# Patient Record
Sex: Female | Born: 1990
Health system: Southern US, Community
[De-identification: ages and names within clinical notes are randomized; demographics above are authoritative.]

## PROBLEM LIST (undated history)

## (undated) DIAGNOSIS — I10 Essential (primary) hypertension: Secondary | ICD-10-CM

## (undated) DIAGNOSIS — E119 Type 2 diabetes mellitus without complications: Secondary | ICD-10-CM

## (undated) DIAGNOSIS — D649 Anemia, unspecified: Secondary | ICD-10-CM

---

## 2014-08-09 ENCOUNTER — Emergency Department (HOSPITAL_COMMUNITY)
Admission: EM | Admit: 2014-08-09 | Discharge: 2014-08-09 | Disposition: A | Discharge status: Home / Self Care | Payer: Commercial Managed Care - PPO | Attending: Emergency Medicine | Admitting: Emergency Medicine

## 2014-08-09 ENCOUNTER — Encounter (HOSPITAL_COMMUNITY): Payer: Self-pay | Admitting: Emergency Medicine

## 2014-08-09 DIAGNOSIS — G43009 Migraine without aura, not intractable, without status migrainosus: Secondary | ICD-10-CM | POA: Insufficient documentation

## 2014-08-09 DIAGNOSIS — R51 Headache: Secondary | ICD-10-CM | POA: Diagnosis present

## 2014-08-09 MED ORDER — DIPHENHYDRAMINE HCL 50 MG/ML IJ SOLN
25.0000 mg | Freq: Once | INTRAMUSCULAR | Status: AC
  Administered 2014-08-09: 25 mg via INTRAVENOUS
  Filled 2014-08-09: qty 1

## 2014-08-09 MED ORDER — METOCLOPRAMIDE HCL 5 MG/ML IJ SOLN
10.0000 mg | Freq: Once | INTRAMUSCULAR | Status: AC
  Administered 2014-08-09: 10 mg via INTRAVENOUS
  Filled 2014-08-09: qty 2

## 2014-08-09 NOTE — ED Provider Notes (Signed)
CSN: [id number]     Arrival date & time 08/09/14  0053 History   First MD Initiated Contact with Patient 08/09/14 0104     Chief Complaint  Patient presents with  . Headache     (Consider location/radiation/quality/duration/timing/severity/associated sxs/prior Treatment) HPI Comments: Patient presents with c/o HA for approximately 6 hours. Patient describes onset of generalized headache pain, now more right-sided, throbbing. Positive photophobia. No phonophobia. No nausea or vomiting, or diarrhea. Patient usually takes Tylenol for her headaches but has not taken anything tonight. No head injury. No dental pain or sinus pressure. No pain with movement of neck. Patient denies signs of stroke including: facial droop, slurred speech, aphasia, weakness/numbness in extremities, imbalance/trouble walking. No vision loss or change.  The history is provided by the patient.    History reviewed. No pertinent past medical history. History reviewed. No pertinent past surgical history. No family history on file. History  Substance Use Topics  . Smoking status: Never Smoker   . Smokeless tobacco: Not on file  . Alcohol Use: No   OB History    No data available     Review of Systems  Constitutional: Negative for fever.  HENT: Negative for congestion, dental problem, rhinorrhea and sinus pressure.   Eyes: Positive for photophobia. Negative for discharge, redness and visual disturbance.  Respiratory: Negative for shortness of breath.   Cardiovascular: Negative for chest pain.  Gastrointestinal: Negative for nausea and vomiting.  Musculoskeletal: Negative for gait problem, neck pain and neck stiffness.  Skin: Negative for rash.  Neurological: Positive for headaches. Negative for syncope, speech difficulty, weakness, light-headedness and numbness.  Psychiatric/Behavioral: Negative for confusion.    Allergies  Review of patient's allergies indicates no known allergies.  Home Medications    Prior to Admission medications   Not on File   BP 162/81 mmHg  Pulse 111  Temp(Src) 98 F (36.7 C) (Oral)  Resp 18  Ht  (1.6 m)  Wt 260 lb (117.935 kg)  BMI 46.07 kg/m2  SpO2 100%  LMP 08/06/2014   Physical Exam  Constitutional: She is oriented to person, place, and time. She appears well-developed and well-nourished.  HENT:  Head: Normocephalic and atraumatic.  Right Ear: Tympanic membrane, external ear and ear canal normal.  Left Ear: Tympanic membrane, external ear and ear canal normal.  Nose: Nose normal.  Mouth/Throat: Uvula is midline, oropharynx is clear and moist and mucous membranes are normal.  Eyes: Conjunctivae, EOM and lids are normal. Pupils are equal, round, and reactive to light. Right eye exhibits no nystagmus. Left eye exhibits no nystagmus.  No papilledema noted on funduscopic exam.   Neck: Normal range of motion. Neck supple.  Cardiovascular: Normal rate and regular rhythm.   Pulmonary/Chest: Effort normal and breath sounds normal. No respiratory distress. She has no wheezes. She has no rales.  Abdominal: Soft. There is no tenderness.  Musculoskeletal:       Cervical back: She exhibits normal range of motion, no tenderness and no bony tenderness.  Neurological: She is alert and oriented to person, place, and time. She has normal strength and normal reflexes. No cranial nerve deficit or sensory deficit. She displays a negative Romberg sign. Coordination and gait normal. GCS eye subscore is 4. GCS verbal subscore is 5. GCS motor subscore is 6.  Skin: Skin is warm and dry.  Psychiatric: She has a normal mood and affect.  Nursing note and vitals reviewed.   ED Course  Procedures (including critical care time) Labs Review  Labs Reviewed - No data to display  Imaging Review No results found.   EKG Interpretation None       1:10 AM Patient seen and examined. Medications ordered.   Vital signs reviewed and are as follows: BP 162/81 mmHg  Pulse  111  Temp(Src) 98 F (36.7 C) (Oral)  Resp 18  Ht 5\' 3"  (1.6 m)  Wt 260 lb (117.935 kg)  BMI 46.07 kg/m2  SpO2 100%  LMP 08/06/2014  2:24 AM HA nearly resolved. Pt appears well. Neuro exam unchanged. Will d/c to home.   Patient urged to return with worsening symptoms or other concerns. Patient verbalized understanding and agrees with plan.    MDM   Final diagnoses:  Migraine without aura and without status migrainosus, not intractable   Patient without high-risk features of headache including: sudden onset/thunderclap HA, no similar headache in past, altered mental status, accompanying seizure, headache with exertion, age > 6650, history of immunocompromise, neck or shoulder pain, fever, use of anticoagulation, family history of spontaneous SAH, concomitant drug use, toxic exposure.   Patient has a normal complete neurological exam, normal vital signs, normal level of consciousness, no signs of meningismus, is well-appearing/non-toxic appearing, no signs of trauma, no papilledema.   Imaging with CT/MRI not indicated given history and physical exam findings.   No dangerous or life-threatening conditions suspected or identified by history, physical exam, and by work-up. No indications for hospitalization identified.      Renne CriglerJoshua Lyla Jasek, PA-C 08/09/14 0225  Cy BlamerApril Palumbo, MD 08/09/14 [phone]

## 2014-08-09 NOTE — Discharge Instructions (Signed)
Please read and follow all provided instructions.  Your diagnoses today include:  1. Migraine without aura and without status migrainosus, not intractable     Tests performed today include:  Vital signs. See below for your results today.   Medications:  In the Emergency Department you received:  Reglan - antinausea/headache medication  Benadryl - antihistamine to counteract potential side effects of reglan  Take any prescribed medications only as directed.  Additional information:  Follow any educational materials contained in this packet.  You are having a headache. No specific cause was found today for your headache. It may have been a migraine or other cause of headache. Stress, anxiety, fatigue, and depression are common triggers for headaches.   Your headache today does not appear to be life-threatening or require hospitalization, but often the exact cause of headaches is not determined in the emergency department. Therefore, follow-up with your doctor is very important to find out what may have caused your headache and whether or not you need any further diagnostic testing or treatment.   Sometimes headaches can appear benign (not harmful), but then more serious symptoms can develop which should prompt an immediate re-evaluation by your doctor or the emergency department.  BE VERY CAREFUL not to take multiple medicines containing Tylenol (also called acetaminophen). Doing so can lead to an overdose which can damage your liver and cause liver failure and possibly death.   Follow-up instructions: Please follow-up with your primary care provider in the next 3 days for further evaluation of your symptoms.   Return instructions:   Please return to the Emergency Department if you experience worsening symptoms.  Return if the medications do not resolve your headache, if it recurs, or if you have multiple episodes of vomiting or cannot keep down fluids.  Return if you have a  change from the usual headache.  RETURN IMMEDIATELY IF you:  Develop a sudden, severe headache  Develop confusion or become poorly responsive or faint  Develop a fever above 100.52F or problem breathing  Have a change in speech, vision, swallowing, or understanding  Develop new weakness, numbness, tingling, incoordination in your arms or legs  Have a seizure  Please return if you have any other emergent concerns.  Additional Information:  Your vital signs today were: BP 162/81 mmHg   Pulse 111   Temp(Src) 98 F (36.7 C) (Oral)   Resp 18   Ht 5\' 3"  (1.6 m)   Wt 260 lb (117.935 kg)   BMI 46.07 kg/m2   SpO2 100%   LMP 08/06/2014 If your blood pressure (BP) was elevated above 135/85 this visit, please have this repeated by your doctor within one month. --------------

## 2014-08-09 NOTE — ED Notes (Signed)
Pt presents with HA onset yesterday. Denies n/v/d, +photophobia.

## 2014-09-13 ENCOUNTER — Emergency Department (HOSPITAL_COMMUNITY)
Admission: EM | Admit: 2014-09-13 | Discharge: 2014-09-13 | Disposition: A | Discharge status: Home / Self Care | Payer: Commercial Managed Care - PPO | Attending: Emergency Medicine | Admitting: Emergency Medicine

## 2014-09-13 ENCOUNTER — Encounter (HOSPITAL_COMMUNITY): Payer: Self-pay | Admitting: Emergency Medicine

## 2014-09-13 DIAGNOSIS — Y998 Other external cause status: Secondary | ICD-10-CM | POA: Insufficient documentation

## 2014-09-13 DIAGNOSIS — X58XXXA Exposure to other specified factors, initial encounter: Secondary | ICD-10-CM | POA: Diagnosis not present

## 2014-09-13 DIAGNOSIS — Y9289 Other specified places as the place of occurrence of the external cause: Secondary | ICD-10-CM | POA: Diagnosis not present

## 2014-09-13 DIAGNOSIS — T161XXA Foreign body in right ear, initial encounter: Secondary | ICD-10-CM | POA: Diagnosis present

## 2014-09-13 DIAGNOSIS — Y9389 Activity, other specified: Secondary | ICD-10-CM | POA: Insufficient documentation

## 2014-09-13 NOTE — ED Notes (Signed)
Pt has had cotton stuck in right ear for 30 minutes. Attempted to remove with tweezers with no success. Denies pain-just says, "it feels funny." A&Ox4 in triage.

## 2014-09-13 NOTE — ED Provider Notes (Signed)
CSN: [id number]     Arrival date & time 09/13/14  1930 History  This chart was scribed for Charlestine Night, PA-C, working with Blake Divine, MD by Chestine Spore, ED Scribe. The patient was seen in room WTR7/WTR7 at 8:45 PM.     Chief Complaint  Patient presents with  . Cotton stuck in ear       The history is provided by the patient. No language interpreter was used.    HPI Comments: Alexandra Berry is a 24 y.o. female who presents to the Emergency Department complaining of cotton stuck in right ear onset 1 hour. Pt notes that she has tried to remove the cotton with tweezers with no success. She notes that there is no pain to her right ear. She denies right ear pain, wound, ear discharge, and any other symptoms.   History reviewed. No pertinent past medical history. History reviewed. No pertinent past surgical history. History reviewed. No pertinent family history. History  Substance Use Topics  . Smoking status: Never Smoker   . Smokeless tobacco: Not on file  . Alcohol Use: No   OB History    No data available     Review of Systems  HENT: Negative for ear discharge and ear pain.        Cotton in right ear  Skin: Negative for color change, rash and wound.      Allergies  Review of patient's allergies indicates no known allergies.  Home Medications   Prior to Admission medications   Medication Sig Start Date End Date Taking? Authorizing Provider  acetaminophen (TYLENOL) 500 MG tablet Take 1,000 mg by mouth every 6 (six) hours as needed for moderate pain or headache.    Historical Provider, MD   BP 173/89 mmHg  Pulse 104  Temp(Src) 98.1 F (36.7 C) (Oral)  Resp 20  Ht  (1.6 m)  Wt 260 lb (117.935 kg)  BMI 46.07 kg/m2  SpO2 100%  LMP 08/18/2014 (Approximate) Physical Exam  Constitutional: She is oriented to person, place, and time. She appears well-developed and well-nourished. No distress.  HENT:  Head: Normocephalic and atraumatic.  Right Ear: A  foreign body is present.  Left Ear: No foreign bodies.  Cotton present in right ear canal.  Neck: No tracheal deviation present.  Pulmonary/Chest: Effort normal. No respiratory distress.  Musculoskeletal: Normal range of motion.  Neurological: She is alert and oriented to person, place, and time.  Skin: Skin is warm and dry.  Psychiatric: She has a normal mood and affect. Her behavior is normal.  Nursing note and vitals reviewed.   ED Course  FOREIGN BODY REMOVAL Date/Time: 09/13/2014 9:35 PM Performed by: Charlestine Night Authorized by: Charlestine Night Consent: Verbal consent obtained. Risks and benefits: risks, benefits and alternatives were discussed Consent given by: patient Patient understanding: patient states understanding of the procedure being performed Patient consent: the patient's understanding of the procedure matches consent given Procedure consent: procedure consent matches procedure scheduled Relevant documents: relevant documents present and verified Body area: ear Location details: right ear Removal mechanism: alligator forceps Complexity: simple 1 objects recovered. Objects recovered: cotton tip swab Post-procedure assessment: foreign body removed Patient tolerance: Patient tolerated the procedure well with no immediate complications   (including critical care time) DIAGNOSTIC STUDIES: Oxygen Saturation is 100% on RA, nl by my interpretation.    COORDINATION OF CARE: 8:46 PM Discussed treatment plan with pt at bedside and pt agreed to plan.    I personally performed the services described  in this documentation, which was scribed in my presence. The recorded information has been reviewed and is accurate. 3  Charlestine Night, PA-C 09/13/14 2137  Blake Divine, MD 09/16/14 [phone]

## 2014-09-13 NOTE — Discharge Instructions (Signed)
Return here as needed. °

## 2016-02-11 ENCOUNTER — Observation Stay (HOSPITAL_COMMUNITY)
Admission: EM | Admit: 2016-02-11 | Discharge: 2016-02-12 | Disposition: A | Discharge status: Home / Self Care | Payer: Commercial Managed Care - PPO | Attending: Internal Medicine | Admitting: Internal Medicine

## 2016-02-11 ENCOUNTER — Encounter (HOSPITAL_COMMUNITY): Payer: Self-pay | Admitting: Emergency Medicine

## 2016-02-11 DIAGNOSIS — Z79899 Other long term (current) drug therapy: Secondary | ICD-10-CM | POA: Diagnosis not present

## 2016-02-11 DIAGNOSIS — R7303 Prediabetes: Secondary | ICD-10-CM | POA: Insufficient documentation

## 2016-02-11 DIAGNOSIS — R000 Tachycardia, unspecified: Secondary | ICD-10-CM | POA: Diagnosis not present

## 2016-02-11 DIAGNOSIS — Z793 Long term (current) use of hormonal contraceptives: Secondary | ICD-10-CM | POA: Diagnosis not present

## 2016-02-11 DIAGNOSIS — N920 Excessive and frequent menstruation with regular cycle: Secondary | ICD-10-CM | POA: Insufficient documentation

## 2016-02-11 DIAGNOSIS — R0602 Shortness of breath: Secondary | ICD-10-CM | POA: Diagnosis not present

## 2016-02-11 DIAGNOSIS — D72829 Elevated white blood cell count, unspecified: Secondary | ICD-10-CM | POA: Insufficient documentation

## 2016-02-11 DIAGNOSIS — Z6841 Body Mass Index (BMI) 40.0 and over, adult: Secondary | ICD-10-CM | POA: Diagnosis not present

## 2016-02-11 DIAGNOSIS — Z7984 Long term (current) use of oral hypoglycemic drugs: Secondary | ICD-10-CM | POA: Diagnosis not present

## 2016-02-11 DIAGNOSIS — I10 Essential (primary) hypertension: Secondary | ICD-10-CM | POA: Insufficient documentation

## 2016-02-11 DIAGNOSIS — D649 Anemia, unspecified: Secondary | ICD-10-CM | POA: Diagnosis not present

## 2016-02-11 DIAGNOSIS — D509 Iron deficiency anemia, unspecified: Secondary | ICD-10-CM | POA: Diagnosis not present

## 2016-02-11 HISTORY — DX: Essential (primary) hypertension: I10

## 2016-02-11 HISTORY — DX: Type 2 diabetes mellitus without complications: E11.9

## 2016-02-11 HISTORY — DX: Anemia, unspecified: D64.9

## 2016-02-11 LAB — COMPREHENSIVE METABOLIC PANEL
ALBUMIN: 3.7 g/dL (ref 3.5–5.0)
ALK PHOS: 60 U/L (ref 38–126)
ALT: 16 U/L (ref 14–54)
ANION GAP: 8 (ref 5–15)
AST: 28 U/L (ref 15–41)
BUN: 13 mg/dL (ref 6–20)
CALCIUM: 8.9 mg/dL (ref 8.9–10.3)
CO2: 24 mmol/L (ref 22–32)
Chloride: 105 mmol/L (ref 101–111)
Creatinine, Ser: 0.49 mg/dL (ref 0.44–1.00)
GFR calc non Af Amer: >60 mL/min (ref >60)
GLUCOSE: 142 mg/dL — AB (ref 65–99)
POTASSIUM: 4.1 mmol/L (ref 3.5–5.1)
Sodium: 137 mmol/L (ref 135–145)
TOTAL PROTEIN: 7.5 g/dL (ref 6.5–8.1)
Total Bilirubin: 0.3 mg/dL (ref 0.3–1.2)

## 2016-02-11 LAB — PROTIME-INR
INR: 1.12
Prothrombin Time: 14.5 seconds (ref 11.4–15.2)

## 2016-02-11 LAB — CBC
HEMATOCRIT: 26.3 % — AB (ref 36.0–46.0)
Hemoglobin: 7 g/dL — ABNORMAL LOW (ref 12.0–15.0)
MCH: 15.9 pg — ABNORMAL LOW (ref 26.0–34.0)
MCHC: 26.6 g/dL — ABNORMAL LOW (ref 30.0–36.0)
MCV: 59.6 fL — ABNORMAL LOW (ref 78.0–100.0)
Platelets: 572 10*3/uL — ABNORMAL HIGH (ref 150–400)
RBC: 4.41 MIL/uL (ref 3.87–5.11)
RDW: 20.4 % — ABNORMAL HIGH (ref 11.5–15.5)
WBC: 10.3 10*3/uL (ref 4.0–10.5)

## 2016-02-11 LAB — IRON AND TIBC
Iron: 10 ug/dL — ABNORMAL LOW (ref 28–170)
Saturation Ratios: 2 % — ABNORMAL LOW (ref 10.4–31.8)
TIBC: 617 ug/dL — AB (ref 250–450)
UIBC: 607 ug/dL

## 2016-02-11 LAB — ABO/RH: ABO/RH(D): O POS

## 2016-02-11 LAB — I-STAT BETA HCG BLOOD, ED (MC, WL, AP ONLY): I-stat hCG, quantitative: <5 m[IU]/mL (ref <5)

## 2016-02-11 LAB — PREPARE RBC (CROSSMATCH)

## 2016-02-11 MED ORDER — VALACYCLOVIR HCL 500 MG PO TABS
500.0000 mg | ORAL_TABLET | Freq: Every day | ORAL | Status: DC
  Filled 2016-02-11 (×2): qty 1

## 2016-02-11 MED ORDER — SODIUM CHLORIDE 0.9 % IV SOLN
Freq: Once | INTRAVENOUS | Status: AC
Start: 2016-02-11 — End: 2016-02-11
  Administered 2016-02-11: 18:00:00 via INTRAVENOUS

## 2016-02-11 MED ORDER — LOSARTAN POTASSIUM 50 MG PO TABS
50.0000 mg | ORAL_TABLET | Freq: Every day | ORAL | Status: DC
  Filled 2016-02-11 (×2): qty 1

## 2016-02-11 MED ORDER — SODIUM CHLORIDE 0.9 % IV BOLUS (SEPSIS)
1000.0000 mL | Freq: Once | INTRAVENOUS | Status: AC
Start: 2016-02-11 — End: 2016-02-11
  Administered 2016-02-11: 1000 mL via INTRAVENOUS

## 2016-02-11 MED ORDER — SODIUM CHLORIDE 0.9% FLUSH: 3.0000 mL | Freq: Two times a day (BID) | INTRAVENOUS | Status: DC

## 2016-02-11 NOTE — ED Notes (Signed)
Patient refusing rectal exam for occult stool test. Made Dr Fredderick PhenixBelfi aware.

## 2016-02-11 NOTE — ED Triage Notes (Addendum)
Pt sent for evaluation for hemoglobin of 6.2; pt reports associated symptoms of dizziness and SOB. Pt just ended heavy menstrual cycle.

## 2016-02-11 NOTE — H&P (Signed)
History and Physical  Alexandra CreamerShaniqua Berry ZOX:[id number] DOB: 12-18-1990 DOA: 02/11/2016  Referring physician: EDP PCP: No PCP Per Patient   Chief Complaint: anemia, sent from urgent care for blood transfusion.  HPI: Alexandra Berry is a 26 y.o. female   With h/o obesity (Body mass index is 44.08 kg/m.), prediabetes, HTN was put on medication a few months ago at a local urgent care center. She was seen at a urgent care a few days ago due to feeling dizzy and sob. She had lab work done showed hgb is 6.2, she was instructed to go the ED for blood transfusion. She states she wasn't able to get here until today because she was out of the area.   She did not  She states she has always had heavy periods, though it has improved since she was put on birth control pills a few months ago,  She's been told she is anemic in the past but has never required a blood transfusion. She just started iron supplementation within the last couple of days. She denies any rectal bleeding, no hematuria, no hematemesis, no hemoptysis.  No lower extremity edema. No chest pain. No cough. No fever, no juandice.   ED course: she presented with sinus tachycardia, bp stable, no hypoxia. EKG with sinus tachycardia, no acute ST/T changes, Labs confirmed microcytic anemia. lft wnl Cr normal. Pregnancy test negative. She is given fluids and started prbc transfusion. hospitalist called to further manage the patient.      Review of Systems:  Detail per HPI, Review of systems are otherwise negative  Past Medical History:  Diagnosis Date  . Anemia   . Diabetes mellitus without complication (HCC)   . Hypertension    History reviewed. No pertinent surgical history. Social History:  reports that she has never smoked. She does not have any smokeless tobacco history on file. She reports that she does not drink alcohol or use drugs. Patient lives at home & is able to participate in activities of daily living independently , she is  currently studies to become a Engineer, civil (consulting)nurse.   Allergies  Allergen Reactions  . Peanut-Containing Drug Products Itching and Swelling    No family history on file.    Prior to Admission medications   Medication Sig Start Date End Date Taking? Authorizing Provider  acetaminophen (TYLENOL) 500 MG tablet Take 1,000 mg by mouth every 6 (six) hours as needed for moderate pain or headache.   Yes Historical Provider, MD  losartan (COZAAR) 50 MG tablet Take 50 mg by mouth daily.   Yes Historical Provider, MD  metFORMIN (GLUCOPHAGE) 500 MG tablet Take 1,000 mg by mouth 2 (two) times daily with a meal.   Yes Historical Provider, MD  Multiple Vitamins-Iron (MULTIVITAMIN/IRON PO) Take 1 tablet by mouth daily.   Yes Historical Provider, MD  Norgestim-Eth Estrad Triphasic (TRI-SPRINTEC PO) Take 1 tablet by mouth daily.   Yes Historical Provider, MD  valACYclovir (VALTREX) 500 MG tablet Take 500 mg by mouth daily.   Yes Historical Provider, MD    Physical Exam: BP 156/82   Pulse 117   Temp 98.8 F (37.1 C)   Resp 19   Ht 5\' 2"  (1.575 m)   Wt 109.3 kg (241 lb)   LMP 02/02/2016   SpO2 100%   BMI 44.08 kg/m   General:  Obese, NAD Eyes: PERRL ENT: unremarkable Neck: supple, no JVD Cardiovascular: sinus tachycardia Respiratory: CTABL Abdomen: soft/ND/ND, positive bowel sounds Skin: no rash Musculoskeletal:  No edema Psychiatric: calm/cooperative  Neurologic: no focal findings            Labs on Admission:  Basic Metabolic Panel:  Recent Labs Lab 02/11/16 1452  NA 137  K 4.1  CL 105  CO2 24  GLUCOSE 142*  BUN 13  CREATININE 0.49  CALCIUM 8.9   Liver Function Tests:  Recent Labs Lab 02/11/16 1452  AST 28  ALT 16  ALKPHOS 60  BILITOT 0.3  PROT 7.5  ALBUMIN 3.7   No results for input(s): LIPASE, AMYLASE in the last 168 hours. No results for input(s): AMMONIA in the last 168 hours. CBC:  Recent Labs Lab 02/11/16 1452  WBC 10.3  HGB 7.0*  HCT 26.3*  MCV 59.6*  PLT  572*   Cardiac Enzymes: No results for input(s): CKTOTAL, CKMB, CKMBINDEX, TROPONINI in the last 168 hours.  BNP (last 3 results) No results for input(s): BNP in the last 8760 hours.  ProBNP (last 3 results) No results for input(s): PROBNP in the last 8760 hours.  CBG: No results for input(s): GLUCAP in the last 168 hours.  Radiological Exams on Admission: No results found.    Assessment/Plan Present on Admission: **None**    Symptomatic Microcytic anemia: likely from heave menses prbc transfusion, likely will benefit from iv iron prior to discharge , and then continue oral iron supplement.   Sob, sinus tachycardia:no hypoxia, no chest pain, no cough.  symptom likely from anemia, she is at baseline active, no edema, no hypoxia, low probability for PE. Expect improvement with blood product and hydration.  HTN; continue home meds losartan.   Heavy menses: she report she is started on birth control pills to help regulate her periods a few months ago, she need to follow up with gyn for this.  Morbid obesity:  Body mass index is 44.08 kg/m. She is diagnosed with prediabetes, recent a1c 4.4. Her home meds metformin held while in the hospital, resume at discharge.   DVT prophylaxis: scd's  Consultants: none  Code Status: full   Family Communication:  Patient and friend in room  Disposition Plan: obs tele, anticipate discharge in am  Time spent:  Shantal Roan MD, PhD [facility] Pager [phone] If 7PM-7AM, please contact night-coverage at [email or web address], password [facility]

## 2016-02-11 NOTE — ED Notes (Signed)
Hospitalist at bedside 

## 2016-02-11 NOTE — ED Provider Notes (Addendum)
[facility]-EMERGENCY DEPT Provider Note   CSN: [id number] Arrival date & time: 02/11/16  1352     History   Chief Complaint Chief Complaint  Patient presents with  . Abnormal Lab    HPI Alexandra Berry is a 26 y.o. female.  Patient with a history of diabetes, hypertension and obesity presents with low hemoglobin. She states over the last 4-5 days she's had dizziness and shortness of breath. She's felt more fatigued than normal. She went to an urgent care where they had blood drawn. The blood work came back yesterday and showed hemoglobin of 6.2. She was instructed to go to the emergency department for a blood transfusion. She states she wasn't able to get here until today because she was out of the area. She states she has always had heavy periods. She's been told she is anemic in the past but has never required a blood transfusion. She just started iron supplementation within the last couple of days. She denies any rectal bleeding. No nausea or vomiting.      Past Medical History:  Diagnosis Date  . Anemia   . Diabetes mellitus without complication (HCC)   . Hypertension     There are no active problems to display for this patient.   History reviewed. No pertinent surgical history.  OB History    No data available       Home Medications    Prior to Admission medications   Medication Sig Start Date End Date Taking? Authorizing Provider  acetaminophen (TYLENOL) 500 MG tablet Take 1,000 mg by mouth every 6 (six) hours as needed for moderate pain or headache.   Yes Historical Provider, MD  losartan (COZAAR) 50 MG tablet Take 50 mg by mouth daily.   Yes Historical Provider, MD  metFORMIN (GLUCOPHAGE) 500 MG tablet Take 1,000 mg by mouth 2 (two) times daily with a meal.   Yes Historical Provider, MD  Multiple Vitamins-Iron (MULTIVITAMIN/IRON PO) Take 1 tablet by mouth daily.   Yes Historical Provider, MD  Norgestim-Eth Estrad Triphasic (TRI-SPRINTEC PO) Take 1 tablet by mouth  daily.   Yes Historical Provider, MD  valACYclovir (VALTREX) 500 MG tablet Take 500 mg by mouth daily.   Yes Historical Provider, MD    Family History No family history on file.  Social History Social History  Substance Use Topics  . Smoking status: Never Smoker  . Smokeless tobacco: Not on file  . Alcohol use No     Allergies   Peanut-containing drug products   Review of Systems Review of Systems  Constitutional: Positive for fatigue. Negative for chills, diaphoresis and fever.  HENT: Negative for congestion, rhinorrhea and sneezing.   Eyes: Negative.   Respiratory: Positive for shortness of breath. Negative for cough and chest tightness.   Cardiovascular: Negative for chest pain and leg swelling.  Gastrointestinal: Negative for abdominal pain, blood in stool, diarrhea, nausea and vomiting.  Genitourinary: Negative for difficulty urinating, flank pain, frequency, hematuria, vaginal bleeding and vaginal discharge.  Musculoskeletal: Negative for arthralgias and back pain.  Skin: Negative for rash.  Neurological: Positive for dizziness and light-headedness. Negative for speech difficulty, weakness, numbness and headaches.     Physical Exam Updated Vital Signs BP 156/82   Pulse 117   Temp 98.8 F (37.1 C)   Resp 19   Ht 5\' 2"  (1.575 m)   Wt 241 lb (109.3 kg)   LMP 02/02/2016   SpO2 100%   BMI 44.08 kg/m   Physical Exam  Constitutional: She  is oriented to person, place, and time. She appears well-developed and well-nourished.  HENT:  Head: Normocephalic and atraumatic.  Eyes: Pupils are equal, round, and reactive to light.  Neck: Normal range of motion. Neck supple.  Cardiovascular: Regular rhythm and normal heart sounds.  Tachycardia present.   Pulmonary/Chest: Effort normal and breath sounds normal. No respiratory distress. She has no wheezes. She has no rales. She exhibits no tenderness.  Abdominal: Soft. Bowel sounds are normal. There is no tenderness. There  is no rebound and no guarding.  Musculoskeletal: Normal range of motion. She exhibits no edema.  Lymphadenopathy:    She has no cervical adenopathy.  Neurological: She is alert and oriented to person, place, and time.  Skin: Skin is warm and dry. No rash noted.  Psychiatric: She has a normal mood and affect.     ED Treatments / Results  Labs (all labs ordered are listed, but only abnormal results are displayed) Labs Reviewed  COMPREHENSIVE METABOLIC PANEL - Abnormal; Notable for the following:       Result Value   Glucose, Bld 142 (*)    All other components within normal limits  CBC - Abnormal; Notable for the following:    Hemoglobin 7.0 (*)    HCT 26.3 (*)    MCV 59.6 (*)    MCH 15.9 (*)    MCHC 26.6 (*)    RDW 20.4 (*)    Platelets 572 (*)    All other components within normal limits  I-STAT BETA HCG BLOOD, ED ([facility], [facility], [facility] ONLY)  POC OCCULT BLOOD, ED  TYPE AND SCREEN  PREPARE RBC (CROSSMATCH)    EKG  EKG Interpretation  Date/Time:  Saturday February 11 2016 15:37:09 EST Ventricular Rate:  117 PR Interval:    QRS Duration: 73 QT Interval:  328 QTC Calculation: 458 R Axis:   58 Text Interpretation:  Sinus tachycardia No old tracing to compare Confirmed by Liberty Seto  MD, Shahil Speegle ([id number]) on 02/11/2016 3:40:36 PM       Radiology No results found.  Procedures Procedures (including critical care time)  Medications Ordered in ED Medications  sodium chloride 0.9 % bolus 1,000 mL (not administered)  0.9 %  sodium chloride infusion (not administered)     Initial Impression / Assessment and Plan / ED Course  I have reviewed the triage vital signs and the nursing notes.  Pertinent labs & imaging results that were available during my care of the patient were reviewed by me and considered in my medical decision making (see chart for details).  Clinical Course     Patient presents with symptomatic anemia. Hemoglobin today is 7.0. She is tachycardic. Her blood pressure  stable. I spoke to her about blood administration and she is amenable. She was type and cross for 2 units of packed RBCs. I spoke with the [facility] service to admit the patient for observation and blood transfusion. Her likely etiology is menorrhagia. She denies any blood in her stool but is currently refusing rectal exam.  Final Clinical Impressions(s) / ED Diagnoses   Final diagnoses:  Symptomatic anemia    New Prescriptions New Prescriptions   No medications on file     Rolan Bucco, MD 02/11/16 6045    Rolan Bucco, MD 02/11/16 1621

## 2016-02-12 ENCOUNTER — Encounter (HOSPITAL_COMMUNITY): Payer: Self-pay

## 2016-02-12 DIAGNOSIS — R000 Tachycardia, unspecified: Secondary | ICD-10-CM | POA: Diagnosis not present

## 2016-02-12 DIAGNOSIS — R7303 Prediabetes: Secondary | ICD-10-CM | POA: Diagnosis not present

## 2016-02-12 DIAGNOSIS — D72829 Elevated white blood cell count, unspecified: Secondary | ICD-10-CM | POA: Diagnosis not present

## 2016-02-12 DIAGNOSIS — D649 Anemia, unspecified: Secondary | ICD-10-CM | POA: Diagnosis not present

## 2016-02-12 DIAGNOSIS — R0602 Shortness of breath: Secondary | ICD-10-CM

## 2016-02-12 LAB — COMPREHENSIVE METABOLIC PANEL
ALK PHOS: 52 U/L (ref 38–126)
ALT: 14 U/L (ref 14–54)
ANION GAP: 8 (ref 5–15)
AST: 21 U/L (ref 15–41)
Albumin: 3.8 g/dL (ref 3.5–5.0)
BILIRUBIN TOTAL: 0.8 mg/dL (ref 0.3–1.2)
BUN: 10 mg/dL (ref 6–20)
CO2: 23 mmol/L (ref 22–32)
Calcium: 8.7 mg/dL — ABNORMAL LOW (ref 8.9–10.3)
Chloride: 108 mmol/L (ref 101–111)
Creatinine, Ser: 0.51 mg/dL (ref 0.44–1.00)
GFR calc Af Amer: >60 mL/min (ref >60)
Glucose, Bld: 93 mg/dL (ref 65–99)
POTASSIUM: 4.3 mmol/L (ref 3.5–5.1)
Sodium: 139 mmol/L (ref 135–145)
TOTAL PROTEIN: 7.2 g/dL (ref 6.5–8.1)

## 2016-02-12 LAB — FOLATE: Folate: 24.7 ng/mL (ref >5.9)

## 2016-02-12 LAB — CBC
HEMATOCRIT: 29.9 % — AB (ref 36.0–46.0)
HEMOGLOBIN: 8.3 g/dL — AB (ref 12.0–15.0)
MCH: 17.3 pg — AB (ref 26.0–34.0)
MCHC: 27.8 g/dL — ABNORMAL LOW (ref 30.0–36.0)
MCV: 62.4 fL — AB (ref 78.0–100.0)
Platelets: 478 10*3/uL — ABNORMAL HIGH (ref 150–400)
RBC: 4.79 MIL/uL (ref 3.87–5.11)
RDW: 22.5 % — AB (ref 11.5–15.5)
WBC: 11.7 10*3/uL — ABNORMAL HIGH (ref 4.0–10.5)

## 2016-02-12 LAB — VITAMIN B12: Vitamin B-12: 929 pg/mL — ABNORMAL HIGH (ref 180–914)

## 2016-02-12 MED ORDER — FERROUS SULFATE 325 (65 FE) MG PO TABS: 325.0000 mg | ORAL_TABLET | Freq: Two times a day (BID) | ORAL | 0 refills | Status: AC

## 2016-02-12 NOTE — Discharge Summary (Signed)
Physician Discharge Summary  Alexandra Berry ZOX:[id number] DOB: 1990/03/26 DOA: 02/11/2016  PCP: No PCP Per Patient  Admit date: 02/11/2016 Discharge date: 02/12/2016  Admitted From: Home Disposition:  Home  Recommendations for Outpatient Follow-up:  1. Follow up with PCP in 1-2 weeks 2. Follow up with OB-GYN in 1 week  3. Please obtain BMP/CBC within in one week  Home Health: No Equipment/Devices: None  Discharge Condition: Stable CODE STATUS: FULL Diet recommendation: Heart Healthy / Carb Modified   Brief/Interim Summary: Alexandra Berry is a 26 y.o. female With h/o obesity (Body mass index is 44.08 kg/m.), prediabetes, HTN was put on medication a few months ago at a local urgent care center. She was seen at a urgent care a few days ago due to feeling dizzy and sob. She had lab work done showed hgb is 6.2, she was instructed to go the ED for blood transfusion. She states she wasn't able to get here until yesterday because she was out of the area. She states she has always had heavy periods, though it has improved since she was put on birth control pills a few months ago,  She's been told she is anemic in the past but has never required a blood transfusion. She just started iron supplementation within the last couple of days. She denies any rectal bleeding, no hematuria, no hematemesis, no hemoptysis.  No lower extremity edema. No chest pain. No cough. No fever, no juandice.  ED course: she presented with sinus tachycardia, bp stable, no hypoxia. EKG with sinus tachycardia, no acute ST/T changes, Labs confirmed microcytic anemia. lft wnl Cr normal. Pregnancy test negative. She is given fluids and started prbc transfusion. hospitalist called to further manage the patient. She was observed overnight and improved with blood transfusion and had no complaints. She was still in Sinus tachycardia but wanted to leave so she will follow up with PCP and Ob-Gyn as an outpatient. She was deemed medically  stable for discharge as she had no symptoms or complaints and felt well.   Discharge Diagnoses:  Active Problems:   Symptomatic anemia  Symptomatic Microcytic anemia likely from Menorrhagia -s/p pRBC transfusion,  -Hb improved to 8.3 from 6.2 -Iron Levels were 10 and TIBC was 617; Discharged on po Iron  -Follow up with OB-GYN for Menorrhagia and PCP -Would not allow Rectal Exam for FOBT  Shortness of Breath -Resolved. No Hypoxia, CP or Cough  Sinus Tachycardia -Symptom likely from anemia, she is at baseline active with no prolonged trips, no edema, no hypoxia, low probability for PE as she has no CP/SOB.  -Intermittent as improved to 97 bpm prior to Discharge -Follow up with PCP as an outpatient; May consider Cardiology Consultation if persistent  Mild Leukocytosis -No Active S/Sx of Infection and Patient Afebrile -Repeat CBC as an outpatient.   HTN -Continue home meds Losartan.   Menorrhagia -She report she is started on birth control pills to help regulate her periods a few months ago -Continue Iron Supplementation -Follow up with OB-GYN  Morbid Obesity:  -Body mass index is 44.08 kg/m.  Prediabetes -Recent A1c 4.4.  -Her home meds metformin held while in the hospital, resume at discharge.  Discharge Instructions  Discharge Instructions    Call MD for:  difficulty breathing, headache or visual disturbances    Complete by:  As directed    Call MD for:  persistant dizziness or light-headedness    Complete by:  As directed    Call MD for:  persistant nausea and  vomiting    Complete by:  As directed    Call MD for:  severe uncontrolled pain    Complete by:  As directed    Call MD for:  temperature >100.4    Complete by:  As directed    Diet - low sodium heart healthy    Complete by:  As directed    Discharge instructions    Complete by:  As directed    Follow up with PCP and OB-GYN; Take medications as prescribed. If symptoms worsen or change please return  to the ED for evaluation.   Increase activity slowly    Complete by:  As directed      Allergies as of 02/12/2016      Reactions   Peanut-containing Drug Products Itching, Swelling      Medication List    TAKE these medications   acetaminophen 500 MG tablet Commonly known as:  TYLENOL Take 1,000 mg by mouth every 6 (six) hours as needed for moderate pain or headache.   ferrous sulfate 325 (65 FE) MG tablet Take 1 tablet (325 mg total) by mouth 2 (two) times daily with a meal.   losartan 50 MG tablet Commonly known as:  COZAAR Take 50 mg by mouth daily.   metFORMIN 500 MG tablet Commonly known as:  GLUCOPHAGE Take 1,000 mg by mouth 2 (two) times daily with a meal.   MULTIVITAMIN/IRON PO Take 1 tablet by mouth daily.   TRI-SPRINTEC PO Take 1 tablet by mouth daily.   valACYclovir 500 MG tablet Commonly known as:  VALTREX Take 500 mg by mouth daily.       Allergies  Allergen Reactions  . Peanut-Containing Drug Products Itching and Swelling    Consultations:  None  Procedures/Studies:  No results found.  Subjective:  Seen and examined at bedside an had no complaints or concerns and felt better and wanted to go home. No SOB or lightheadedness or dizziness now.   Discharge Exam: Vitals:   02/12/16 0320 02/12/16 0432  BP: 138/82 (!) 143/93  Pulse: 99 98  Resp: 17 16  Temp: 98.6 F (37 C) 98.2 F (36.8 C)   Vitals:   02/12/16 0023 02/12/16 0049 02/12/16 0320 02/12/16 0432  BP:  (!) 148/78 138/82 (!) 143/93  Pulse:  93 99 98  Resp:  16 17 16   Temp: 98.5 F (36.9 C) 98.5 F (36.9 C) 98.6 F (37 C) 98.2 F (36.8 C)  TempSrc:  Oral Oral Oral  SpO2:  100% 100% 100%  Weight:      Height:       General: Pt is a morbidly obese AAF who is alert, awake, not in acute distress Cardiovascular: Tachycardic Rate but Regular Rhythm, S1/S2 +, no rubs, no gallops Respiratory: CTA bilaterally, no wheezing, no rhonchi Abdominal: Soft, NT, ND, bowel sounds  + Extremities: no edema, no cyanosis  The results of significant diagnostics from this hospitalization (including imaging, microbiology, ancillary and laboratory) are listed below for reference.    Microbiology: No results found for this or any previous visit (from the past 240 hour(s)).   Labs: BNP (last 3 results) No results for input(s): BNP in the last 8760 hours. Basic Metabolic Panel:  Recent Labs Lab 02/11/16 1452 02/12/16 0701  NA 137 139  K 4.1 4.3  CL 105 108  CO2 24 23  GLUCOSE 142* 93  BUN 13 10  CREATININE 0.49 0.51  CALCIUM 8.9 8.7*   Liver Function Tests:  Recent Labs Lab  02/11/16 1452 02/12/16 0701  AST 28 21  ALT 16 14  ALKPHOS 60 52  BILITOT 0.3 0.8  PROT 7.5 7.2  ALBUMIN 3.7 3.8   No results for input(s): LIPASE, AMYLASE in the last 168 hours. No results for input(s): AMMONIA in the last 168 hours. CBC:  Recent Labs Lab 02/11/16 1452 02/12/16 0701  WBC 10.3 11.7*  HGB 7.0* 8.3*  HCT 26.3* 29.9*  MCV 59.6* 62.4*  PLT 572* 478*   Cardiac Enzymes: No results for input(s): CKTOTAL, CKMB, CKMBINDEX, TROPONINI in the last 168 hours. BNP: Invalid input(s): POCBNP CBG: No results for input(s): GLUCAP in the last 168 hours. D-Dimer No results for input(s): DDIMER in the last 72 hours. Hgb A1c No results for input(s): HGBA1C in the last 72 hours. Lipid Profile No results for input(s): CHOL, HDL, LDLCALC, TRIG, CHOLHDL, LDLDIRECT in the last 72 hours. Thyroid function studies No results for input(s): TSH, T4TOTAL, T3FREE, THYROIDAB in the last 72 hours.  Invalid input(s): FREET3 Anemia work up  Recent Labs  02/11/16 1645 02/12/16 0701  VITAMINB12  --  929*  FOLATE  --  24.7  TIBC 617*  --   IRON 10*  --    Urinalysis No results found for: COLORURINE, APPEARANCEUR, LABSPEC, PHURINE, GLUCOSEU, HGBUR, BILIRUBINUR, KETONESUR, PROTEINUR, UROBILINOGEN, NITRITE, LEUKOCYTESUR Sepsis Labs Invalid input(s): PROCALCITONIN,  WBC,   LACTICIDVEN Microbiology No results found for this or any previous visit (from the past 240 hour(s)).  Time coordinating discharge: Over 30 minutes  SIGNED:  Merlene Laughter, DO [facility] 02/12/2016, 1:36 PM Pager [phone]  If 7PM-7AM, please contact night-coverage [email or web address] Password [email or web address]

## 2016-02-13 LAB — TYPE AND SCREEN
BLOOD PRODUCT EXPIRATION DATE: 201801122359
BLOOD PRODUCT EXPIRATION DATE: 201801192359
ISSUE DATE / TIME: 201801061805
ISSUE DATE / TIME: 201801070013
UNIT TYPE AND RH: 5100
Unit Type and Rh: 5100

## 2016-10-22 ENCOUNTER — Other Ambulatory Visit: Payer: Self-pay | Admitting: Family Medicine

## 2016-10-22 ENCOUNTER — Telehealth (HOSPITAL_COMMUNITY): Payer: Self-pay | Admitting: Family Medicine

## 2016-10-22 DIAGNOSIS — R011 Cardiac murmur, unspecified: Secondary | ICD-10-CM

## 2016-10-29 NOTE — Telephone Encounter (Signed)
User: Trina Ao A Date/time: 10/22/16 3:03 PM  Comment: Called pt and spoke with her to sch echo, she voiced that she just started a new jobs and would have to figure out her schedule..she will CB in a couple of days.   Context:  Outcome: Completed  Phone number: [phone] Phone Type: Home Phone  Comm. type: Telephone Call type: Outgoing  Contact: Junius Creamer Relation to patient: Self

## 2017-01-21 ENCOUNTER — Other Ambulatory Visit: Payer: Self-pay | Admitting: Family Medicine

## 2017-01-21 DIAGNOSIS — R011 Cardiac murmur, unspecified: Secondary | ICD-10-CM

## 2017-01-28 ENCOUNTER — Other Ambulatory Visit: Payer: Self-pay

## 2017-01-28 ENCOUNTER — Ambulatory Visit (HOSPITAL_COMMUNITY): Payer: Managed Care, Other (non HMO) | Attending: Cardiology

## 2017-01-28 DIAGNOSIS — I10 Essential (primary) hypertension: Secondary | ICD-10-CM | POA: Insufficient documentation

## 2017-01-28 DIAGNOSIS — R011 Cardiac murmur, unspecified: Secondary | ICD-10-CM | POA: Diagnosis present

## 2019-04-01 ENCOUNTER — Other Ambulatory Visit: Payer: Self-pay

## 2019-04-01 DIAGNOSIS — N6310 Unspecified lump in the right breast, unspecified quadrant: Secondary | ICD-10-CM

## 2019-04-21 ENCOUNTER — Other Ambulatory Visit: Payer: Self-pay

## 2019-04-21 ENCOUNTER — Encounter: Payer: Self-pay | Admitting: Advanced Practice Midwife

## 2019-04-21 ENCOUNTER — Ambulatory Visit: Payer: Self-pay | Admitting: Advanced Practice Midwife

## 2019-04-21 ENCOUNTER — Ambulatory Visit: Payer: Managed Care, Other (non HMO)

## 2019-04-21 VITALS — BP 148/84 | Temp 98.6°F | Wt 248.0 lb

## 2019-04-21 DIAGNOSIS — Z1239 Encounter for other screening for malignant neoplasm of breast: Secondary | ICD-10-CM

## 2019-04-21 NOTE — Progress Notes (Signed)
Ms. Julien Berryman is a 29 y.o. female who presents to [facility] clinic today with complaint of right breast lump.    Pap Smear: Pap not smear completed today. Last Pap smear was 2019 at [facility] clinic and was normal. Per patient has no history of an abnormal Pap smear. Last Pap smear result is not available in Epic.   Physical exam: Breasts Breasts symmetrical. No skin abnormalities bilateral breasts. No nipple retraction bilateral breasts. No nipple discharge bilateral breasts. No lymphadenopathy. No lumps palpated bilateral breasts.       Pelvic/Bimanual Pap is not indicated today    Smoking History: Patient has never smoked Not referred to quit line.    Patient Navigation: Patient education provided. Access to services provided for patient through [facility] program. No interpreter provided. No transportation provided   Colorectal Cancer Screening: Per patient has never had colonoscopy completed No complaints today.    Breast and Cervical Cancer Risk Assessment: Patient has family history of breast cancer, known genetic mutations, or radiation treatment to the chest before age 89. Patient does not have history of cervical dysplasia, immunocompromised, or DES exposure in-utero.  Risk Assessment    Risk Scores      04/21/2019   Last edited by: Narda Rutherford, LPN   5-year risk:    Lifetime risk:           A: BCCCP exam without pap smear Complaint of right breast lump   P: Referred patient to [facility] for a diagnostic mammogram. Appointment scheduled 04/23/2019 at 3:20pm .  Thressa Sheller DNP, CNM  04/21/19  2:01 PM

## 2019-05-05 ENCOUNTER — Other Ambulatory Visit: Payer: Self-pay

## 2019-05-05 ENCOUNTER — Ambulatory Visit
Admission: RE | Admit: 2019-05-05 | Discharge: 2019-05-05 | Disposition: A | Discharge status: Home / Self Care | Payer: Managed Care, Other (non HMO) | Source: Ambulatory Visit | Attending: Obstetrics and Gynecology | Admitting: Obstetrics and Gynecology

## 2019-05-05 ENCOUNTER — Ambulatory Visit: Admission: RE | Admit: 2019-05-05 | Payer: No Typology Code available for payment source | Source: Ambulatory Visit

## 2019-05-05 DIAGNOSIS — N6310 Unspecified lump in the right breast, unspecified quadrant: Secondary | ICD-10-CM

## 2021-01-19 IMAGING — US US BREAST*R* LIMITED INC AXILLA
1 series · 7 of 7 positions shown · non-contrast
Comparison: Baseline evaluation

CLINICAL DATA: Palpable abnormality in the UPPER-OUTER QUADRANT of
the RIGHT breast. Patient's mother was diagnosed and treated for
breast cancer approximately 1 year ago.

EXAM:
ULTRASOUND OF THE RIGHT BREAST

[Series 1: us breast*right* limited inc axilla · 0.06mm/px · 7 of 7 slices shown]
[im 1/7]
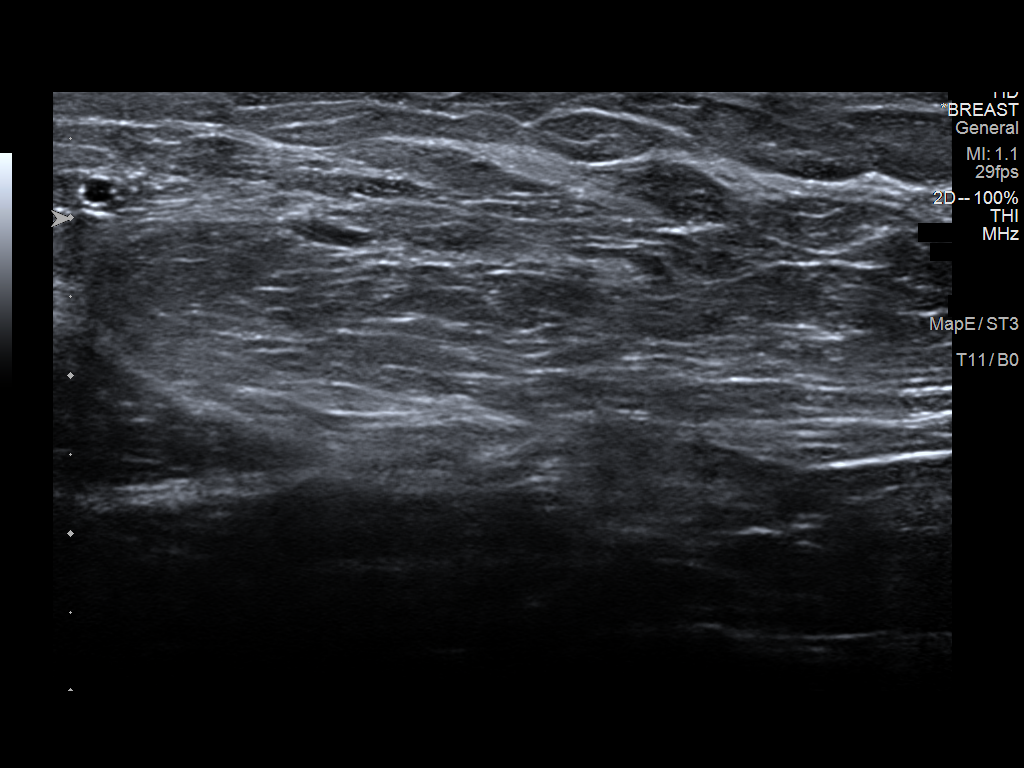
[im 2/7]
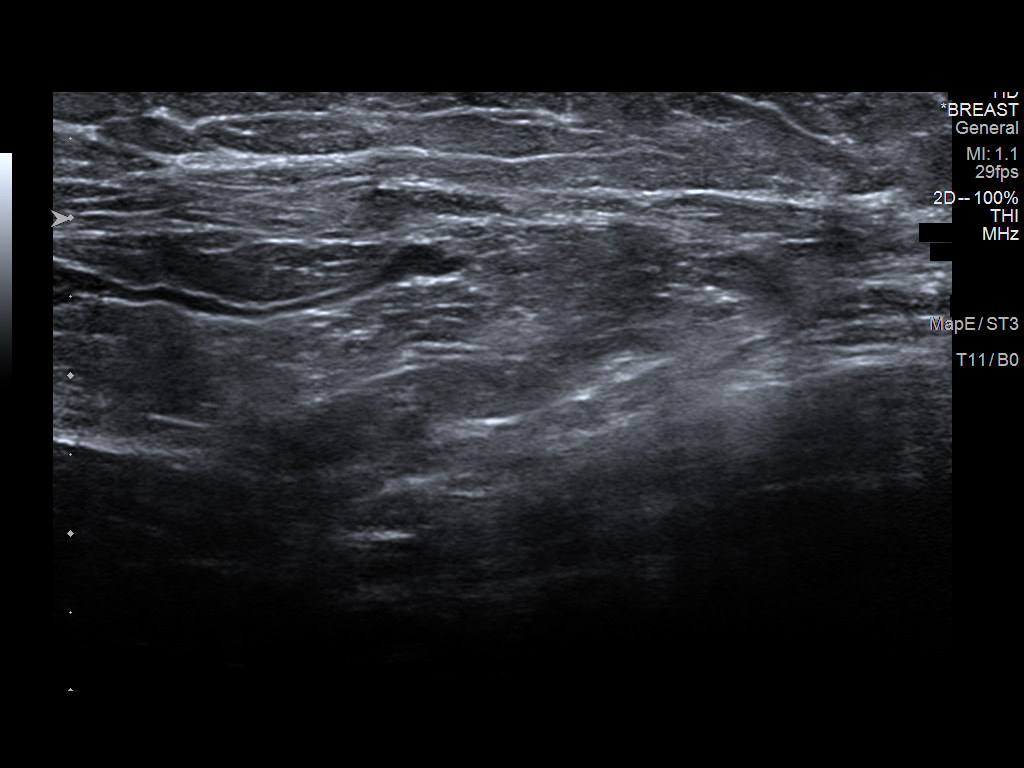
[im 3/7]
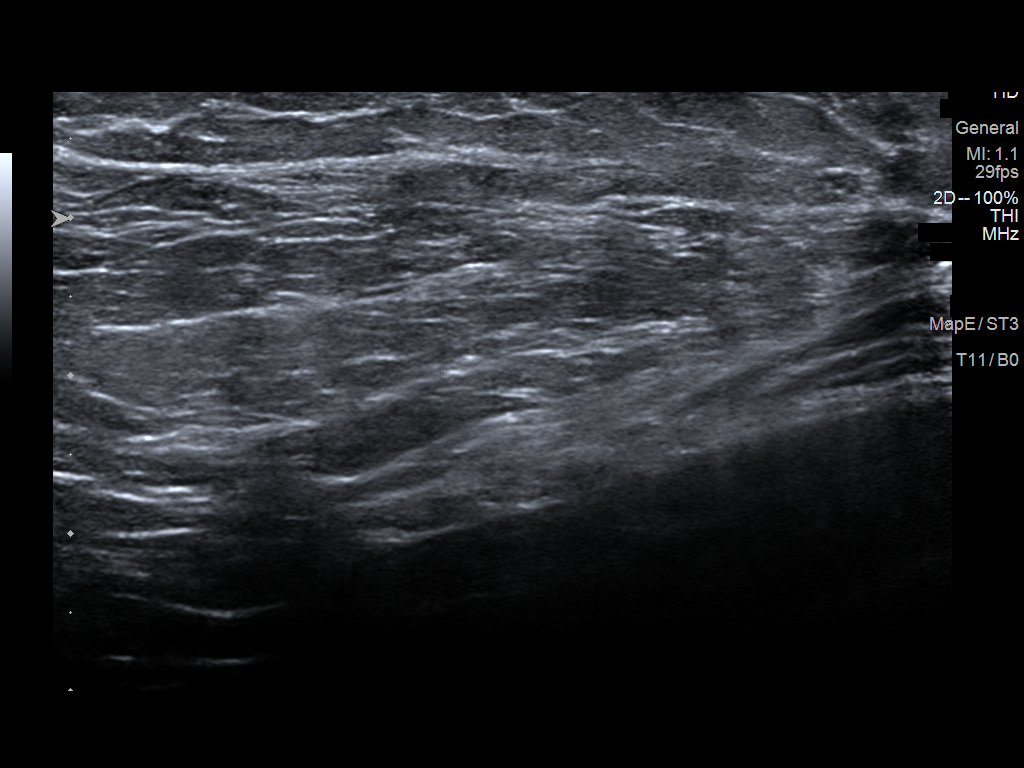
[im 4/7]
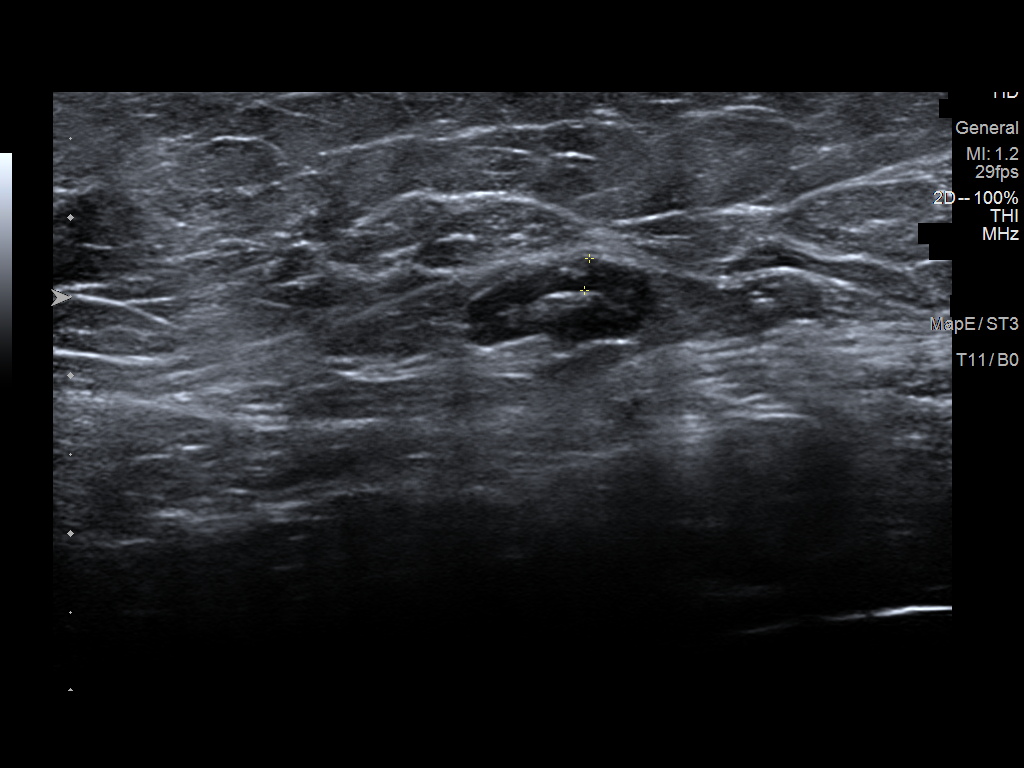
[im 5/7]
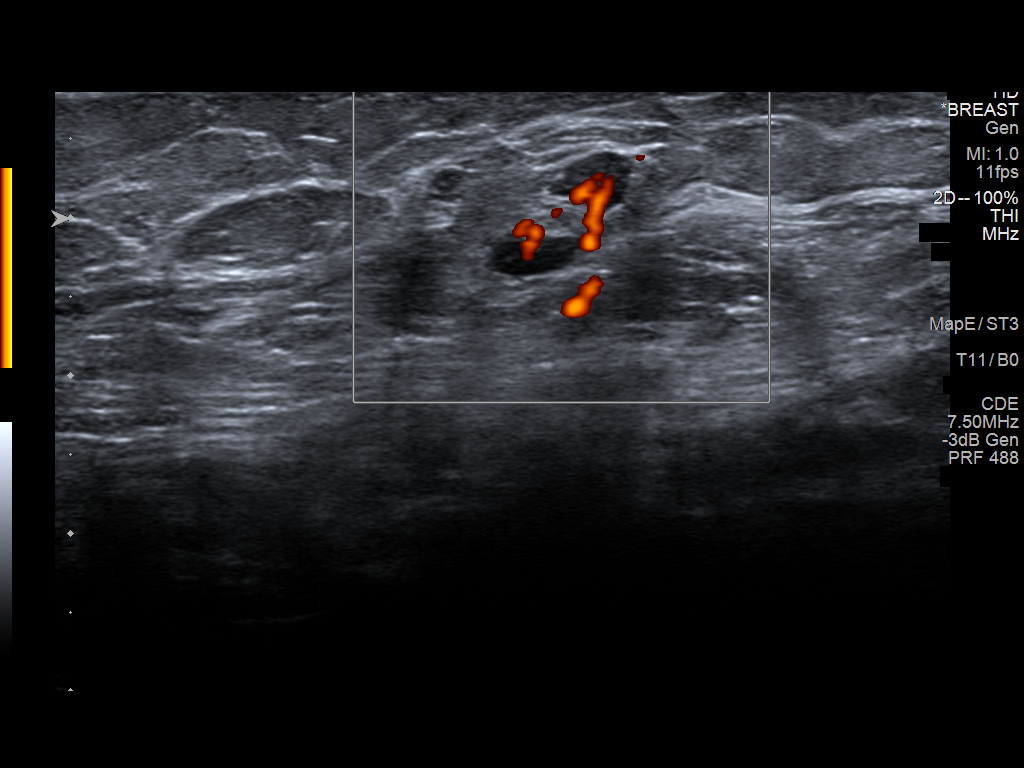
[im 6/7]
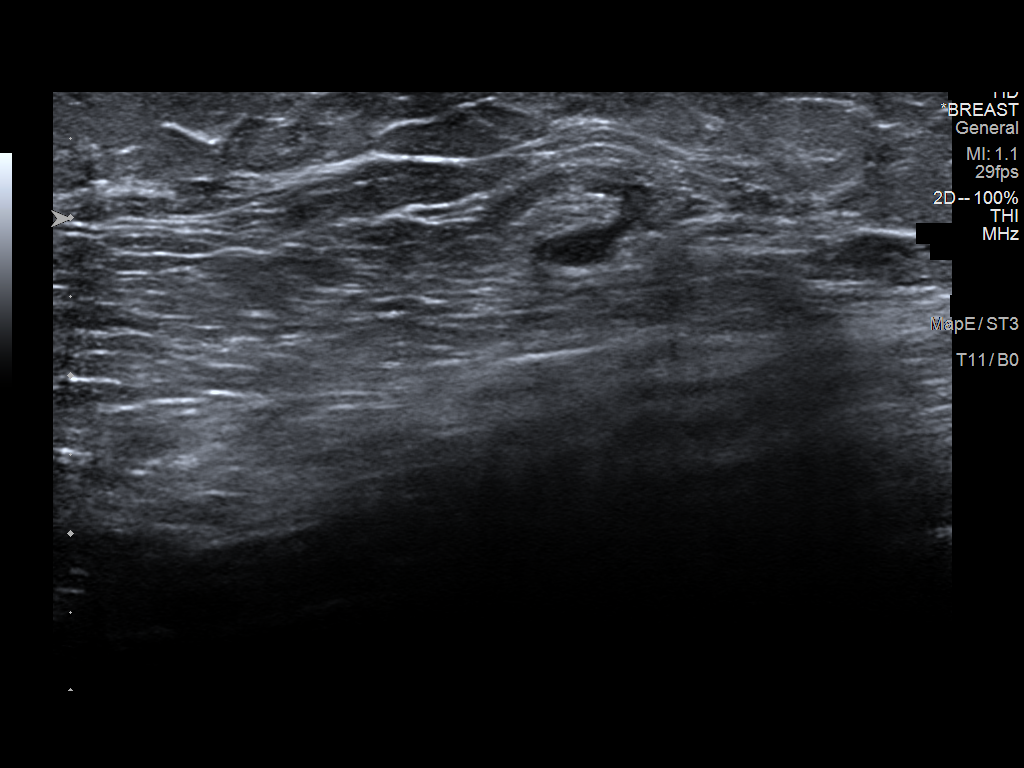
[im 7/7]
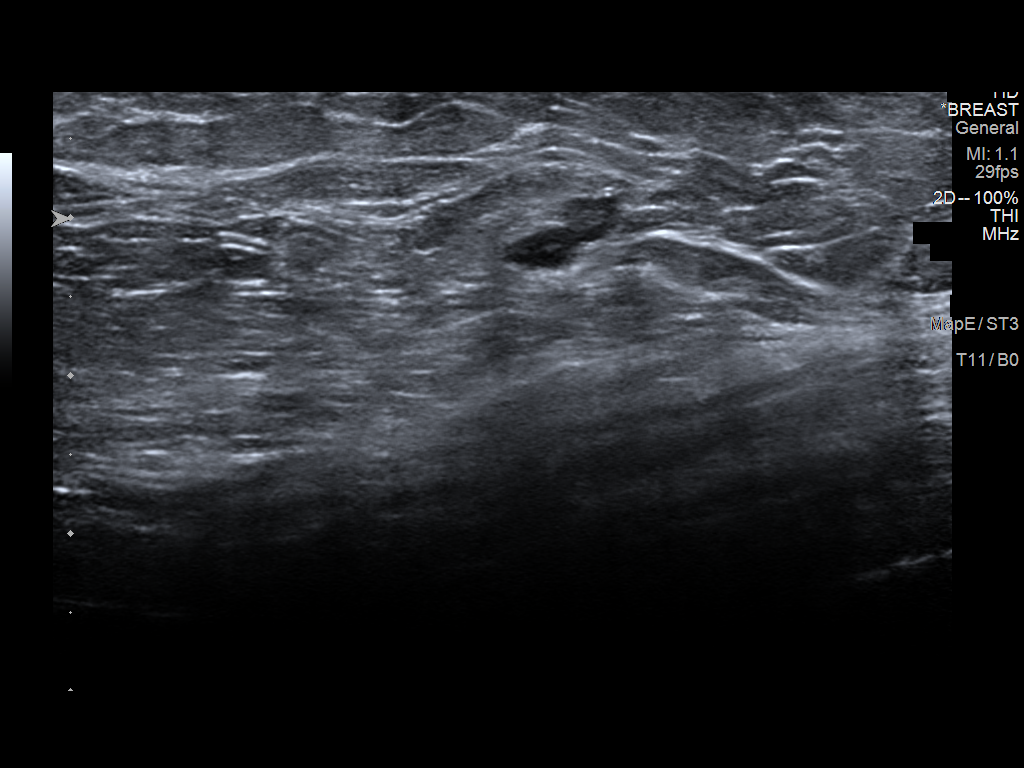

[7 of 7 positions shown; findings below may reference images not displayed]

FINDINGS: On physical exam, I palpate no discrete mass in the UPPER-OUTER
QUADRANT of the RIGHT breast/anterior RIGHT axilla.

Targeted ultrasound is performed, showing normal appearing
fibrofatty tissue in the area of patient's concern. Lymph nodes with
normal contour identified in the RIGHT axilla.
IMPRESSION: No ultrasound evidence for malignancy.

RECOMMENDATION:
Screening mammogram at age 40 unless there are persistent or
intervening clinical concerns. (Code:[id number]-[id number])

I have discussed the findings and recommendations with the patient.
If applicable, a reminder letter will be sent to the patient
regarding the next appointment.

BI-RADS CATEGORY  1: Negative.

## 2022-09-04 ENCOUNTER — Other Ambulatory Visit: Payer: Self-pay

## 2022-09-05 ENCOUNTER — Other Ambulatory Visit: Payer: Self-pay

## 2022-09-06 ENCOUNTER — Other Ambulatory Visit: Payer: Self-pay

## 2022-09-06 MED ORDER — VALSARTAN 320 MG PO TABS
320.0000 mg | ORAL_TABLET | Freq: Every day | ORAL | 0 refills | Status: AC
  Filled 2022-10-01: qty 90, 90d supply, fill #0

## 2022-09-06 MED ORDER — HYDROCHLOROTHIAZIDE 25 MG PO TABS
25.0000 mg | ORAL_TABLET | Freq: Every day | ORAL | 0 refills | Status: AC
  Filled 2022-09-06: qty 30, 30d supply, fill #0

## 2022-09-06 MED ORDER — VALSARTAN-HYDROCHLOROTHIAZIDE 320-25 MG PO TABS
1.0000 | ORAL_TABLET | Freq: Every day | ORAL | 0 refills | Status: DC
  Filled 2022-09-06: qty 30, 30d supply, fill #0
  Filled 2022-10-01 – 2022-10-04 (×4): qty 30, 30d supply, fill #1
  Filled 2022-10-31: qty 30, 30d supply, fill #2

## 2022-10-01 ENCOUNTER — Other Ambulatory Visit (HOSPITAL_COMMUNITY): Payer: Self-pay

## 2022-10-01 ENCOUNTER — Other Ambulatory Visit: Payer: Self-pay

## 2022-10-02 ENCOUNTER — Other Ambulatory Visit: Payer: Self-pay

## 2022-10-04 ENCOUNTER — Other Ambulatory Visit: Payer: Self-pay

## 2022-10-04 ENCOUNTER — Other Ambulatory Visit (HOSPITAL_COMMUNITY): Payer: Self-pay

## 2022-10-04 ENCOUNTER — Encounter (HOSPITAL_COMMUNITY): Payer: Self-pay

## 2022-10-05 ENCOUNTER — Other Ambulatory Visit (HOSPITAL_COMMUNITY): Payer: Self-pay

## 2022-10-31 ENCOUNTER — Other Ambulatory Visit: Payer: Self-pay

## 2022-11-02 ENCOUNTER — Other Ambulatory Visit: Payer: Self-pay

## 2022-12-10 ENCOUNTER — Telehealth: Payer: Self-pay

## 2022-12-10 NOTE — Telephone Encounter (Signed)
Received call from patient regarding her sleep referral from [facility] [place]. Transferred to sleep lab to discuss

## 2022-12-17 ENCOUNTER — Other Ambulatory Visit (HOSPITAL_COMMUNITY): Payer: Self-pay

## 2022-12-20 ENCOUNTER — Other Ambulatory Visit: Payer: Self-pay

## 2022-12-21 ENCOUNTER — Other Ambulatory Visit: Payer: Self-pay

## 2022-12-21 DIAGNOSIS — E282 Polycystic ovarian syndrome: Secondary | ICD-10-CM | POA: Diagnosis not present

## 2022-12-21 DIAGNOSIS — Z803 Family history of malignant neoplasm of breast: Secondary | ICD-10-CM | POA: Diagnosis not present

## 2022-12-21 DIAGNOSIS — Z0000 Encounter for general adult medical examination without abnormal findings: Secondary | ICD-10-CM | POA: Diagnosis not present

## 2022-12-21 DIAGNOSIS — Z1231 Encounter for screening mammogram for malignant neoplasm of breast: Secondary | ICD-10-CM | POA: Diagnosis not present

## 2022-12-21 DIAGNOSIS — D649 Anemia, unspecified: Secondary | ICD-10-CM | POA: Diagnosis not present

## 2022-12-21 DIAGNOSIS — Z113 Encounter for screening for infections with a predominantly sexual mode of transmission: Secondary | ICD-10-CM | POA: Diagnosis not present

## 2022-12-21 DIAGNOSIS — I10 Essential (primary) hypertension: Secondary | ICD-10-CM | POA: Diagnosis not present

## 2022-12-21 DIAGNOSIS — Z23 Encounter for immunization: Secondary | ICD-10-CM | POA: Diagnosis not present

## 2022-12-21 MED ORDER — VALSARTAN-HYDROCHLOROTHIAZIDE 320-25 MG PO TABS
1.0000 | ORAL_TABLET | Freq: Every day | ORAL | 0 refills | Status: DC
  Filled 2022-12-21: qty 90, 90d supply, fill #0

## 2022-12-24 ENCOUNTER — Other Ambulatory Visit: Payer: Self-pay

## 2022-12-25 ENCOUNTER — Other Ambulatory Visit: Payer: Self-pay

## 2022-12-25 MED ORDER — MOUNJARO 5 MG/0.5ML ~~LOC~~ SOAJ
5.0000 mg | SUBCUTANEOUS | 0 refills | Status: DC
  Filled 2022-12-25: qty 2, 28d supply, fill #0

## 2022-12-26 ENCOUNTER — Other Ambulatory Visit: Payer: Self-pay

## 2022-12-28 ENCOUNTER — Other Ambulatory Visit: Payer: Self-pay

## 2023-01-02 NOTE — Telephone Encounter (Signed)
Spoke with patient today and let her know that I have reached out to her referring Dentist two separate times requesting visit notes as the referral we received was just a demographics sheet and did not have any visit notes with it. The patient said she would reach out to the office to have it sent over. Will have the referral re-reviewed for scheduling once we have received those notes

## 2023-01-04 ENCOUNTER — Other Ambulatory Visit: Payer: Self-pay

## 2023-01-25 ENCOUNTER — Other Ambulatory Visit: Payer: Self-pay

## 2023-01-25 DIAGNOSIS — I10 Essential (primary) hypertension: Secondary | ICD-10-CM | POA: Diagnosis not present

## 2023-01-25 DIAGNOSIS — E1165 Type 2 diabetes mellitus with hyperglycemia: Secondary | ICD-10-CM | POA: Diagnosis not present

## 2023-01-25 DIAGNOSIS — Z6841 Body Mass Index (BMI) 40.0 and over, adult: Secondary | ICD-10-CM | POA: Diagnosis not present

## 2023-01-25 DIAGNOSIS — Z23 Encounter for immunization: Secondary | ICD-10-CM | POA: Diagnosis not present

## 2023-01-25 MED ORDER — DEXCOM G7 SENSOR MISC
0 refills | Status: DC
  Filled 2023-01-25: qty 3, 30d supply, fill #0

## 2023-01-28 ENCOUNTER — Other Ambulatory Visit: Payer: Self-pay

## 2023-01-29 ENCOUNTER — Other Ambulatory Visit: Payer: Self-pay

## 2023-01-29 MED ORDER — MOUNJARO 5 MG/0.5ML ~~LOC~~ SOAJ
5.0000 mg | SUBCUTANEOUS | 0 refills | Status: DC
  Filled 2023-01-29: qty 2, 28d supply, fill #0

## 2023-02-02 DIAGNOSIS — R0981 Nasal congestion: Secondary | ICD-10-CM | POA: Diagnosis not present

## 2023-02-02 DIAGNOSIS — R051 Acute cough: Secondary | ICD-10-CM | POA: Diagnosis not present

## 2023-02-02 DIAGNOSIS — R509 Fever, unspecified: Secondary | ICD-10-CM | POA: Diagnosis not present

## 2023-02-02 DIAGNOSIS — J069 Acute upper respiratory infection, unspecified: Secondary | ICD-10-CM | POA: Diagnosis not present

## 2023-02-04 ENCOUNTER — Other Ambulatory Visit: Payer: Self-pay

## 2023-02-12 DIAGNOSIS — Z111 Encounter for screening for respiratory tuberculosis: Secondary | ICD-10-CM | POA: Diagnosis not present

## 2023-02-14 DIAGNOSIS — Z0279 Encounter for issue of other medical certificate: Secondary | ICD-10-CM | POA: Diagnosis not present

## 2023-02-14 DIAGNOSIS — Z111 Encounter for screening for respiratory tuberculosis: Secondary | ICD-10-CM | POA: Diagnosis not present

## 2023-03-04 ENCOUNTER — Other Ambulatory Visit: Payer: Self-pay

## 2023-03-05 ENCOUNTER — Other Ambulatory Visit: Payer: Self-pay

## 2023-03-05 MED ORDER — MOUNJARO 5 MG/0.5ML ~~LOC~~ SOAJ
5.0000 mg | SUBCUTANEOUS | 0 refills | Status: AC
  Filled 2023-03-05: qty 2, 28d supply, fill #0

## 2023-03-05 MED ORDER — DEXCOM G7 SENSOR MISC
0 refills | Status: DC
  Filled 2023-03-05: qty 3, 30d supply, fill #0

## 2023-03-07 ENCOUNTER — Other Ambulatory Visit: Payer: Self-pay

## 2023-03-29 DIAGNOSIS — G4739 Other sleep apnea: Secondary | ICD-10-CM | POA: Diagnosis not present

## 2023-03-29 DIAGNOSIS — E1165 Type 2 diabetes mellitus with hyperglycemia: Secondary | ICD-10-CM | POA: Diagnosis not present

## 2023-03-29 DIAGNOSIS — I10 Essential (primary) hypertension: Secondary | ICD-10-CM | POA: Diagnosis not present

## 2023-03-29 DIAGNOSIS — Z6841 Body Mass Index (BMI) 40.0 and over, adult: Secondary | ICD-10-CM | POA: Diagnosis not present

## 2023-03-29 DIAGNOSIS — D649 Anemia, unspecified: Secondary | ICD-10-CM | POA: Diagnosis not present

## 2023-03-30 ENCOUNTER — Other Ambulatory Visit: Payer: Self-pay

## 2023-03-30 MED ORDER — VALSARTAN-HYDROCHLOROTHIAZIDE 320-25 MG PO TABS
1.0000 | ORAL_TABLET | Freq: Every day | ORAL | 1 refills | Status: DC
  Filled 2023-03-30: qty 90, 90d supply, fill #0
  Filled 2023-05-30 – 2023-07-01 (×2): qty 90, 90d supply, fill #1

## 2023-04-01 ENCOUNTER — Other Ambulatory Visit: Payer: Self-pay

## 2023-04-01 MED ORDER — MOUNJARO 7.5 MG/0.5ML ~~LOC~~ SOAJ
7.5000 mg | SUBCUTANEOUS | 0 refills | Status: DC
Start: 2023-03-29 — End: 2023-05-06
  Filled 2023-04-01: qty 2, 28d supply, fill #0

## 2023-04-03 ENCOUNTER — Other Ambulatory Visit: Payer: Self-pay

## 2023-04-03 MED ORDER — DEXCOM G7 SENSOR MISC
3 refills | Status: DC
Start: 2023-04-03 — End: 2023-04-16
  Filled 2023-04-03: qty 3, 30d supply, fill #0

## 2023-04-04 ENCOUNTER — Other Ambulatory Visit: Payer: Self-pay

## 2023-04-16 ENCOUNTER — Encounter: Payer: Self-pay | Admitting: Physician Assistant

## 2023-04-16 ENCOUNTER — Other Ambulatory Visit (HOSPITAL_COMMUNITY)
Admission: RE | Admit: 2023-04-16 | Discharge: 2023-04-16 | Disposition: A | Discharge status: Home / Self Care | Source: Ambulatory Visit | Attending: Physician Assistant | Admitting: Physician Assistant

## 2023-04-16 ENCOUNTER — Ambulatory Visit: Admitting: Physician Assistant

## 2023-04-16 VITALS — BP 139/83 | HR 97 | Ht 62.0 in | Wt 246.0 lb

## 2023-04-16 DIAGNOSIS — Z3202 Encounter for pregnancy test, result negative: Secondary | ICD-10-CM

## 2023-04-16 DIAGNOSIS — B3731 Acute candidiasis of vulva and vagina: Secondary | ICD-10-CM

## 2023-04-16 DIAGNOSIS — Z113 Encounter for screening for infections with a predominantly sexual mode of transmission: Secondary | ICD-10-CM | POA: Insufficient documentation

## 2023-04-16 DIAGNOSIS — I10 Essential (primary) hypertension: Secondary | ICD-10-CM | POA: Diagnosis not present

## 2023-04-16 DIAGNOSIS — B9689 Other specified bacterial agents as the cause of diseases classified elsewhere: Secondary | ICD-10-CM

## 2023-04-16 LAB — POCT URINE PREGNANCY: Preg Test, Ur: NEGATIVE

## 2023-04-16 NOTE — Patient Instructions (Addendum)
 VISIT SUMMARY:  During today's visit, we discussed your concerns about sexually transmitted diseases (STDs) and pregnancy, as well as your elevated blood pressure. We performed several tests to screen for STDs and pregnancy, and we talked about the importance of taking your blood pressure medication regularly.  YOUR PLAN:  -SEXUALLY TRANSMITTED INFECTION (STI) SCREENING: We performed a series of tests to screen for sexually transmitted infections and pregnancy. This includes a vaginal swab for gonorrhea, chlamydia, trichomonas, bacterial vaginitis, and yeast infection, a blood draw for HIV and syphilis, and a urine pregnancy test. We will provide your results via MyChart and a follow-up call. Please return for re-evaluation if you develop any symptoms.  -HYPERTENSION: Hypertension means high blood pressure. Your blood pressure was elevated today at 139/83 mmHg, and you mentioned missing doses of your medication. It is important to take your blood pressure medication, losartan and hydrochlorothiazide, every morning. Try to associate taking your medication with a daily routine, like brushing your teeth, to help you remember.  INSTRUCTIONS:  We will provide your test results via MyChart and a follow-up call. Please return for re-evaluation if you develop any symptoms. Continue to monitor your blood pressure at home and take your medication regularly.  How to Have Safe Sex Having safe sex means taking steps before and during sex to reduce your risk of: Getting a sexually transmitted infection (STI). Giving your partner an STI. Unwanted or unplanned pregnancy. How to have safe sex Ways you can have safe sex  Limit your sex partners to only one partner who is only having sex with you. Avoid using alcohol and drugs before having sex. Alcohol and drugs can affect your judgment. Before having sex with a new partner: Talk to your partner about past partners, past STIs, and drug use. Get screened for  STIs and discuss the results with your partner. Ask your partner to get screened too. Check your body regularly for sores, blisters, rashes, or unusual discharge. If you notice any of these things, call your health care provider. Avoid sexual contact if you have symptoms of an infection or you're being treated for an STI. While having sex, use a condom. Make sure to: Use a condom every time you have vaginal, oral, or anal sex. Both females and males should wear condoms during oral sex. Do not use a female condom and a female condom at the same time during vaginal sex. Using both types at the same time can cause condoms to break. Keep condoms in place from the beginning to the end of sexual activity. Use a latex condom, if possible. Latex condoms offer the best protection. Use only water-based lubricants with a condom. Using petroleum-based lubricants or oils will weaken the condom and increase the chance that it will break. Ways your health care provider can help you have safe sex  See your provider for regular screenings, exams, and tests for STIs. Talk with your provider about what kind of birth control is best for you. Get vaccinated against hepatitis B and human papillomavirus (HPV). If you're at risk of getting human immunodeficiency virus (HIV), talk with your provider about taking a medicine to prevent HIV. You're at risk for HIV if you: Are a female who has sex with other males. Are sexually active with more than one partner. Take drugs by injection. Have a sex partner who has HIV. Have unprotected sex. Have sex with someone who has sex with both males and females. Follow these instructions at home: Take your medicines only  as told. Call your provider if you think you might be pregnant. Call your provider if have any symptoms of an infection. Where to find more information Centers for Disease Control and Prevention (CDC): [employer].no Office on Women's Health: [employer] This  information is not intended to replace advice given to you by your health care provider. Make sure you discuss any questions you have with your health care provider. Document Revised: 06/13/2022 Document Reviewed: 06/13/2022 Elsevier Patient Education  2024 [employer].

## 2023-04-16 NOTE — Progress Notes (Signed)
 New Patient Office Visit  Subjective    Patient ID: Alexandra Berry, female    DOB: 09-08-1990  Age: 33 y.o. MRN: [id number]  CC:  Chief Complaint  Patient presents with   Menstrual Problem   STD screening    Discussed the use of AI scribe software for clinical note transcription with the patient, who gave verbal consent to proceed.  History of Present Illness        The patient, with a history of irregular menstrual cycles, requests screening for sexually transmitted diseases (STDs) and pregnancy. She denies known exposure to STDs and reports that protection was used during her last sexual encounter, however "it broke". She denies any symptoms such as discharge or lesions. Her menstrual cycles occur monthly but not at a consistent time each month. She has a primary care provider managing her irregular cycles. She also reports elevated blood pressure, which she monitors at home and is typically in the same range as today's reading. She is on blood pressure medication but admits to missing doses.   Outpatient Encounter Medications as of 04/16/2023  Medication Sig   tirzepatide (MOUNJARO) 5 MG/0.5ML Pen Inject 5 mg into the skin once a week.   tirzepatide (MOUNJARO) 7.5 MG/0.5ML Pen Inject 7.5 mg into the skin every 7 (seven) days.   valsartan (DIOVAN) 320 MG tablet Take 1 tablet (320 mg total) by mouth daily.   valsartan-hydrochlorothiazide (DIOVAN-HCT) 320-25 MG tablet Take 1 tablet by mouth daily.   acetaminophen (TYLENOL) 500 MG tablet Take 1,000 mg by mouth every 6 (six) hours as needed for moderate pain or headache.   ferrous sulfate 325 (65 FE) MG tablet Take 1 tablet (325 mg total) by mouth 2 (two) times daily with a meal.   hydrochlorothiazide (HYDRODIURIL) 25 MG tablet Take 1 tablet (25 mg total) by mouth daily.   lisinopril (ZESTRIL) 20 MG tablet Take 20 mg by mouth daily.   losartan (COZAAR) 50 MG tablet Take 50 mg by mouth daily.   metFORMIN (GLUCOPHAGE) 500 MG  tablet Take 1,000 mg by mouth 2 (two) times daily with a meal.   Multiple Vitamins-Iron (MULTIVITAMIN/IRON PO) Take 1 tablet by mouth daily.   Norgestim-Eth Estrad Triphasic (TRI-SPRINTEC PO) Take 1 tablet by mouth daily.   [DISCONTINUED] Continuous Glucose Sensor (DEXCOM G7 SENSOR) MISC Change sensor every 10 days. (Patient not taking: Reported on 04/16/2023)   [DISCONTINUED] Continuous Glucose Sensor (DEXCOM G7 SENSOR) MISC As directed for 30 days (Patient not taking: Reported on 04/16/2023)   [DISCONTINUED] valACYclovir (VALTREX) 500 MG tablet Take 500 mg by mouth daily. (Patient not taking: Reported on 04/16/2023)   [DISCONTINUED] valsartan-hydrochlorothiazide (DIOVAN-HCT) 320-25 MG tablet Take 1 tablet by mouth daily.   No facility-administered encounter medications on file as of 04/16/2023.    Past Medical History:  Diagnosis Date   Anemia    Diabetes mellitus without complication (HCC)    Hypertension     History reviewed. No pertinent surgical history.  Family History  Problem Relation Age of Onset   Breast cancer Mother     Social History   Socioeconomic History   Marital status: Single    Spouse name: Not on file   Number of children: 0   Years of education: Not on file   Highest education level: Bachelor's degree (e.g., BA, AB, BS)  Occupational History   Not on file  Tobacco Use   Smoking status: Never   Smokeless tobacco: Never  Substance and Sexual Activity   Alcohol  use: No   Drug use: Never   Sexual activity: Not Currently  Other Topics Concern   Not on file  Social History Narrative   Not on file   Social Drivers of Health   Financial Resource Strain: Unknown (08/22/2021)   Received from [facility], [facility] visits prior to 04/07/2022.   Overall Financial Resource Strain (CARDIA)    Difficulty of Paying Living Expenses: Patient declined  Food Insecurity: No Food Insecurity (01/18/2022)   Received from [facility], [facility] visits prior to 04/07/2022.   Hunger Vital Sign    Worried About Running Out of Food in the Last Year: Never true    Ran Out of Food in the Last Year: Never true  Transportation Needs: No Transportation Needs (01/18/2022)   Received from [facility], [facility] visits prior to 04/07/2022.   PRAPARE - Administrator, Civil Service (Medical): No    Lack of Transportation (Non-Medical): No  Physical Activity: Insufficiently Active (08/22/2021)   [facility], [facility] visits prior to 04/07/2022.   Exercise Vital Sign    Days of Exercise per Week: 3 days    Minutes of Exercise per Session: 30 min  Stress: Unknown (08/22/2021)   Received from [facility], [facility] visits prior to 04/07/2022.   [employer] of Occupational Health - Occupational Stress Questionnaire    Feeling of Stress : Patient declined  Social Connections: Unknown (08/22/2021)   [facility] visits prior to 04/07/2022.   Social Advertising account executive [NHANES]    Frequency of Communication with Friends and Family: Patient refused    Frequency of Social Gatherings with Friends and Family: Patient refused    Attends Religious Services: Patient refused    Database administrator or Organizations: Patient refused    Attends Banker Meetings: Patient refused    Marital Status: Patient refused  Intimate Partner Violence: Not on file    Review of Systems  Constitutional: Negative.   HENT: Negative.    Eyes: Negative.   Respiratory:  Negative for shortness of breath.   Cardiovascular:  Negative for chest pain.  Gastrointestinal: Negative.   Genitourinary: Negative.   Musculoskeletal: Negative.   Skin: Negative.   Neurological: Negative.   Endo/Heme/Allergies: Negative.   Psychiatric/Behavioral: Negative.          Objective    BP 139/83 (BP  Location: Left Arm, Patient Position: Sitting, Cuff Size: Large)   Pulse 97   Ht 5\' 2"  (1.575 m)   Wt 246 lb (111.6 kg)   SpO2 98%   BMI 44.99 kg/m   Physical Exam Vitals and nursing note reviewed.  Constitutional:      Appearance: Normal appearance.  HENT:     Head: Normocephalic and atraumatic.     Right Ear: External ear normal.     Left Ear: External ear normal.     Nose: Nose normal.     Mouth/Throat:     Mouth: Mucous membranes are moist.     Pharynx: Oropharynx is clear.  Eyes:     Extraocular Movements: Extraocular movements intact.     Conjunctiva/sclera: Conjunctivae normal.     Pupils: Pupils are equal, round, and reactive to light.  Cardiovascular:     Rate and Rhythm: Normal rate and regular rhythm.     Pulses: Normal pulses.  Heart sounds: Normal heart sounds.  Pulmonary:     Effort: Pulmonary effort is normal.     Breath sounds: Normal breath sounds.  Musculoskeletal:        General: Normal range of motion.     Cervical back: Normal range of motion and neck supple.  Skin:    General: Skin is warm and dry.  Neurological:     General: No focal deficit present.     Mental Status: She is alert.  Psychiatric:        Mood and Affect: Mood normal.        Behavior: Behavior normal.        Thought Content: Thought content normal.        Judgment: Judgment normal.         Assessment & Plan:   Problem List Items Addressed This Visit   None Visit Diagnoses       Screen for sexually transmitted diseases    -  Primary   Relevant Orders   Cervicovaginal ancillary only   HIV antibody (with reflex)   RPR     Elevated blood pressure reading in office with diagnosis of hypertension         Pregnancy examination or test, negative result       Relevant Orders   POCT urine pregnancy (Completed)     1. Screen for sexually transmitted diseases (Primary) Patient education given on safe sex practices. - Cervicovaginal ancillary only - HIV antibody (with  reflex) - RPR  2. Elevated blood pressure reading in office with diagnosis of hypertension Blood pressure elevated at 139/83 mmHg. Non-adherence to losartan and hydrochlorothiazide noted. - Advise taking blood pressure medication in the morning, associating with daily routine like brushing teeth.   3. Pregnancy examination or test, negative result  - POCT urine pregnancy   I have reviewed the patient's medical history (PMH, PSH, Social History, Family History, Medications, and allergies) , and have been updated if relevant. I spent 30 minutes reviewing chart and  face to face time with patient.   Return if symptoms worsen or fail to improve.   Kasandra Knudsen Mayers, PA-C

## 2023-04-17 ENCOUNTER — Encounter: Payer: Self-pay | Admitting: Physician Assistant

## 2023-04-17 ENCOUNTER — Other Ambulatory Visit: Payer: Self-pay

## 2023-04-17 LAB — CERVICOVAGINAL ANCILLARY ONLY
Bacterial Vaginitis (gardnerella): POSITIVE — AB
Candida Glabrata: NEGATIVE
Candida Vaginitis: POSITIVE — AB
Chlamydia: NEGATIVE
Comment: NEGATIVE
Comment: NEGATIVE
Comment: NEGATIVE
Comment: NEGATIVE
Comment: NEGATIVE
Comment: NORMAL
Neisseria Gonorrhea: NEGATIVE
Trichomonas: NEGATIVE

## 2023-04-17 LAB — HIV ANTIBODY (ROUTINE TESTING W REFLEX): HIV Screen 4th Generation wRfx: NONREACTIVE

## 2023-04-17 LAB — RPR: RPR Ser Ql: NONREACTIVE

## 2023-04-17 MED ORDER — FLUCONAZOLE 150 MG PO TABS
150.0000 mg | ORAL_TABLET | Freq: Once | ORAL | 0 refills | Status: AC
  Filled 2023-04-17: qty 1, 1d supply, fill #0

## 2023-04-17 MED ORDER — METRONIDAZOLE 500 MG PO TABS
500.0000 mg | ORAL_TABLET | Freq: Two times a day (BID) | ORAL | 0 refills | Status: AC
  Filled 2023-04-17: qty 14, 7d supply, fill #0

## 2023-04-17 NOTE — Addendum Note (Signed)
 Addended by: Roney Jaffe on: 04/17/2023 02:10 PM   Modules accepted: Orders

## 2023-04-19 ENCOUNTER — Other Ambulatory Visit: Payer: Self-pay

## 2023-04-24 DIAGNOSIS — I10 Essential (primary) hypertension: Secondary | ICD-10-CM | POA: Diagnosis not present

## 2023-04-24 DIAGNOSIS — G4719 Other hypersomnia: Secondary | ICD-10-CM | POA: Diagnosis not present

## 2023-05-05 ENCOUNTER — Other Ambulatory Visit: Payer: Self-pay

## 2023-05-06 ENCOUNTER — Other Ambulatory Visit: Payer: Self-pay

## 2023-05-06 MED ORDER — MOUNJARO 7.5 MG/0.5ML ~~LOC~~ SOAJ
7.5000 mg | SUBCUTANEOUS | 0 refills | Status: AC
Start: 2023-05-06 — End: ?
  Filled 2023-05-06: qty 2, 28d supply, fill #0

## 2023-05-07 ENCOUNTER — Other Ambulatory Visit: Payer: Self-pay

## 2023-05-23 DIAGNOSIS — G4733 Obstructive sleep apnea (adult) (pediatric): Secondary | ICD-10-CM | POA: Diagnosis not present

## 2023-05-28 DIAGNOSIS — Z6841 Body Mass Index (BMI) 40.0 and over, adult: Secondary | ICD-10-CM | POA: Diagnosis not present

## 2023-05-28 DIAGNOSIS — G4733 Obstructive sleep apnea (adult) (pediatric): Secondary | ICD-10-CM | POA: Diagnosis not present

## 2023-05-30 ENCOUNTER — Other Ambulatory Visit: Payer: Self-pay

## 2023-05-31 ENCOUNTER — Other Ambulatory Visit: Payer: Self-pay

## 2023-05-31 MED ORDER — MOUNJARO 10 MG/0.5ML ~~LOC~~ SOAJ
10.0000 mg | SUBCUTANEOUS | 0 refills | Status: DC
  Filled 2023-05-31: qty 2, 28d supply, fill #0

## 2023-06-06 ENCOUNTER — Other Ambulatory Visit: Payer: Self-pay

## 2023-06-11 ENCOUNTER — Other Ambulatory Visit: Payer: Self-pay

## 2023-06-28 DIAGNOSIS — E1165 Type 2 diabetes mellitus with hyperglycemia: Secondary | ICD-10-CM | POA: Diagnosis not present

## 2023-06-28 DIAGNOSIS — Z6841 Body Mass Index (BMI) 40.0 and over, adult: Secondary | ICD-10-CM | POA: Diagnosis not present

## 2023-06-28 DIAGNOSIS — Z113 Encounter for screening for infections with a predominantly sexual mode of transmission: Secondary | ICD-10-CM | POA: Diagnosis not present

## 2023-07-01 ENCOUNTER — Other Ambulatory Visit: Payer: Self-pay

## 2023-07-02 ENCOUNTER — Other Ambulatory Visit: Payer: Self-pay

## 2023-07-02 MED ORDER — MOUNJARO 10 MG/0.5ML ~~LOC~~ SOAJ
10.0000 mg | SUBCUTANEOUS | 2 refills | Status: DC
  Filled 2023-07-02: qty 2, 28d supply, fill #0
  Filled 2023-07-28: qty 2, 28d supply, fill #1
  Filled 2023-08-22: qty 2, 28d supply, fill #2

## 2023-07-03 ENCOUNTER — Other Ambulatory Visit: Payer: Self-pay

## 2023-07-05 ENCOUNTER — Other Ambulatory Visit: Payer: Self-pay

## 2023-07-10 ENCOUNTER — Other Ambulatory Visit: Payer: Self-pay

## 2023-07-10 MED ORDER — DEXCOM G7 SENSOR MISC
1.0000 | 3 refills | Status: AC
  Filled 2023-07-10: qty 3, 30d supply, fill #0
  Filled 2023-08-12: qty 3, 30d supply, fill #1

## 2023-07-11 ENCOUNTER — Other Ambulatory Visit: Payer: Self-pay

## 2023-08-02 ENCOUNTER — Other Ambulatory Visit: Payer: Self-pay

## 2023-08-21 ENCOUNTER — Other Ambulatory Visit: Payer: Self-pay

## 2023-08-22 ENCOUNTER — Other Ambulatory Visit: Payer: Self-pay

## 2023-09-16 ENCOUNTER — Other Ambulatory Visit: Payer: Self-pay

## 2023-09-16 DIAGNOSIS — Z1151 Encounter for screening for human papillomavirus (HPV): Secondary | ICD-10-CM | POA: Diagnosis not present

## 2023-09-16 DIAGNOSIS — Z118 Encounter for screening for other infectious and parasitic diseases: Secondary | ICD-10-CM | POA: Diagnosis not present

## 2023-09-16 DIAGNOSIS — N920 Excessive and frequent menstruation with regular cycle: Secondary | ICD-10-CM | POA: Diagnosis not present

## 2023-09-16 DIAGNOSIS — Z124 Encounter for screening for malignant neoplasm of cervix: Secondary | ICD-10-CM | POA: Diagnosis not present

## 2023-09-16 DIAGNOSIS — E282 Polycystic ovarian syndrome: Secondary | ICD-10-CM | POA: Diagnosis not present

## 2023-09-16 MED ORDER — LEVONORGESTREL-ETHINYL ESTRAD 0.1-20 MG-MCG PO TABS
1.0000 | ORAL_TABLET | Freq: Every day | ORAL | 3 refills | Status: AC
  Filled 2023-09-16: qty 84, 84d supply, fill #0
  Filled 2023-12-09: qty 84, 84d supply, fill #1

## 2023-09-22 ENCOUNTER — Other Ambulatory Visit: Payer: Self-pay

## 2023-09-23 ENCOUNTER — Other Ambulatory Visit: Payer: Self-pay

## 2023-09-23 DIAGNOSIS — D649 Anemia, unspecified: Secondary | ICD-10-CM | POA: Diagnosis not present

## 2023-09-23 DIAGNOSIS — I10 Essential (primary) hypertension: Secondary | ICD-10-CM | POA: Diagnosis not present

## 2023-09-23 DIAGNOSIS — E1165 Type 2 diabetes mellitus with hyperglycemia: Secondary | ICD-10-CM | POA: Diagnosis not present

## 2023-09-23 DIAGNOSIS — Z6841 Body Mass Index (BMI) 40.0 and over, adult: Secondary | ICD-10-CM | POA: Diagnosis not present

## 2023-09-23 DIAGNOSIS — L309 Dermatitis, unspecified: Secondary | ICD-10-CM | POA: Diagnosis not present

## 2023-09-23 MED ORDER — TRIAMCINOLONE ACETONIDE 0.1 % EX OINT
1.0000 | TOPICAL_OINTMENT | Freq: Two times a day (BID) | CUTANEOUS | 0 refills | Status: AC
  Filled 2023-09-23: qty 80, 40d supply, fill #0

## 2023-09-23 MED ORDER — MOUNJARO 12.5 MG/0.5ML ~~LOC~~ SOAJ
12.5000 mg | SUBCUTANEOUS | 0 refills | Status: DC
  Filled 2023-09-23: qty 2, 28d supply, fill #0
  Filled 2023-10-18: qty 2, 28d supply, fill #1
  Filled 2023-11-15: qty 2, 28d supply, fill #2

## 2023-09-23 MED ORDER — MOUNJARO 10 MG/0.5ML ~~LOC~~ SOAJ
10.0000 mg | SUBCUTANEOUS | 0 refills | Status: AC
  Filled 2023-09-23: qty 2, 28d supply, fill #0

## 2023-09-26 ENCOUNTER — Other Ambulatory Visit: Payer: Self-pay

## 2023-10-18 ENCOUNTER — Other Ambulatory Visit: Payer: Self-pay

## 2023-10-22 ENCOUNTER — Other Ambulatory Visit: Payer: Self-pay

## 2023-10-22 MED ORDER — VALSARTAN-HYDROCHLOROTHIAZIDE 320-25 MG PO TABS
1.0000 | ORAL_TABLET | Freq: Every day | ORAL | 1 refills | Status: AC
  Filled 2023-10-22: qty 90, 90d supply, fill #0
  Filled 2024-01-16: qty 90, 90d supply, fill #1

## 2023-10-24 ENCOUNTER — Other Ambulatory Visit: Payer: Self-pay

## 2023-10-29 ENCOUNTER — Other Ambulatory Visit: Payer: Self-pay

## 2023-11-21 ENCOUNTER — Other Ambulatory Visit: Payer: Self-pay

## 2023-11-21 DIAGNOSIS — G4733 Obstructive sleep apnea (adult) (pediatric): Secondary | ICD-10-CM | POA: Diagnosis not present

## 2023-11-22 ENCOUNTER — Other Ambulatory Visit: Payer: Self-pay

## 2023-11-26 ENCOUNTER — Other Ambulatory Visit: Payer: Self-pay

## 2023-12-12 ENCOUNTER — Other Ambulatory Visit: Payer: Self-pay

## 2023-12-12 MED ORDER — MOUNJARO 12.5 MG/0.5ML ~~LOC~~ SOAJ
12.5000 mg | SUBCUTANEOUS | 0 refills | Status: AC
  Filled 2023-12-14: qty 2, 28d supply, fill #0

## 2023-12-14 ENCOUNTER — Other Ambulatory Visit: Payer: Self-pay

## 2023-12-20 ENCOUNTER — Other Ambulatory Visit: Payer: Self-pay

## 2023-12-20 DIAGNOSIS — N924 Excessive bleeding in the premenopausal period: Secondary | ICD-10-CM | POA: Diagnosis not present

## 2023-12-27 ENCOUNTER — Other Ambulatory Visit: Payer: Self-pay

## 2023-12-27 DIAGNOSIS — G8929 Other chronic pain: Secondary | ICD-10-CM | POA: Diagnosis not present

## 2023-12-27 DIAGNOSIS — M51362 Other intervertebral disc degeneration, lumbar region with discogenic back pain and lower extremity pain: Secondary | ICD-10-CM | POA: Diagnosis not present

## 2023-12-27 MED ORDER — PREDNISONE 50 MG PO TABS
ORAL_TABLET | ORAL | 0 refills | Status: AC
  Filled 2023-12-27: qty 5, 5d supply, fill #0

## 2023-12-27 MED ORDER — MELOXICAM 15 MG PO TABS
ORAL_TABLET | ORAL | 3 refills | Status: AC
  Filled 2023-12-27: qty 30, 30d supply, fill #0
  Filled 2024-02-10: qty 30, 30d supply, fill #1

## 2024-01-10 DIAGNOSIS — D649 Anemia, unspecified: Secondary | ICD-10-CM | POA: Diagnosis not present

## 2024-01-10 DIAGNOSIS — Z0000 Encounter for general adult medical examination without abnormal findings: Secondary | ICD-10-CM | POA: Diagnosis not present

## 2024-01-10 DIAGNOSIS — Z6841 Body Mass Index (BMI) 40.0 and over, adult: Secondary | ICD-10-CM | POA: Diagnosis not present

## 2024-01-10 DIAGNOSIS — E282 Polycystic ovarian syndrome: Secondary | ICD-10-CM | POA: Diagnosis not present

## 2024-01-10 DIAGNOSIS — E1165 Type 2 diabetes mellitus with hyperglycemia: Secondary | ICD-10-CM | POA: Diagnosis not present

## 2024-01-10 DIAGNOSIS — I10 Essential (primary) hypertension: Secondary | ICD-10-CM | POA: Diagnosis not present

## 2024-01-10 DIAGNOSIS — Z23 Encounter for immunization: Secondary | ICD-10-CM | POA: Diagnosis not present

## 2024-01-10 DIAGNOSIS — Z113 Encounter for screening for infections with a predominantly sexual mode of transmission: Secondary | ICD-10-CM | POA: Diagnosis not present

## 2024-01-13 ENCOUNTER — Other Ambulatory Visit: Payer: Self-pay

## 2024-01-15 ENCOUNTER — Other Ambulatory Visit: Payer: Self-pay

## 2024-01-15 MED ORDER — MOUNJARO 15 MG/0.5ML ~~LOC~~ SOAJ
15.0000 mg | SUBCUTANEOUS | 1 refills | Status: AC
  Filled 2024-01-15: qty 6, 84d supply, fill #0
  Filled 2024-01-16: qty 2, 28d supply, fill #0
  Filled 2024-02-10: qty 2, 28d supply, fill #1
  Filled 2024-03-12: qty 2, 28d supply, fill #2

## 2024-01-16 ENCOUNTER — Other Ambulatory Visit: Payer: Self-pay

## 2024-01-17 ENCOUNTER — Other Ambulatory Visit: Payer: Self-pay

## 2024-01-17 DIAGNOSIS — M5416 Radiculopathy, lumbar region: Secondary | ICD-10-CM | POA: Diagnosis not present

## 2024-01-27 DIAGNOSIS — M5416 Radiculopathy, lumbar region: Secondary | ICD-10-CM | POA: Diagnosis not present

## 2024-02-11 ENCOUNTER — Other Ambulatory Visit: Payer: Self-pay

## 2024-02-11 MED ORDER — GABAPENTIN 300 MG PO CAPS
ORAL_CAPSULE | ORAL | 0 refills | Status: DC
  Filled 2024-02-11: qty 90, 37d supply, fill #0

## 2024-02-13 ENCOUNTER — Other Ambulatory Visit: Payer: Self-pay

## 2024-02-13 MED ORDER — MISOPROSTOL 200 MCG PO TABS
200.0000 ug | ORAL_TABLET | Freq: Once | ORAL | 0 refills | Status: AC
  Filled 2024-02-13: qty 1, 1d supply, fill #0

## 2024-02-13 MED ORDER — IBUPROFEN 800 MG PO TABS
800.0000 mg | ORAL_TABLET | Freq: Three times a day (TID) | ORAL | 0 refills | Status: AC
  Filled 2024-02-13 – 2024-03-12 (×2): qty 20, 7d supply, fill #0

## 2024-02-24 ENCOUNTER — Other Ambulatory Visit: Payer: Self-pay

## 2024-03-12 ENCOUNTER — Other Ambulatory Visit: Payer: Self-pay

## 2024-03-12 MED ORDER — GABAPENTIN 300 MG PO CAPS
300.0000 mg | ORAL_CAPSULE | Freq: Two times a day (BID) | ORAL | 3 refills | Status: AC
  Filled 2024-03-12: qty 180, 90d supply, fill #0

## 2024-03-13 ENCOUNTER — Other Ambulatory Visit: Payer: Self-pay
